# Patient Record
Sex: Female | Born: 1945 | Race: White | Hispanic: No | Marital: Married | Smoking: Former smoker
Health system: Southern US, Community
[De-identification: ages and names within clinical notes are randomized; demographics above are authoritative.]

## PROBLEM LIST (undated history)

## (undated) DIAGNOSIS — M1990 Unspecified osteoarthritis, unspecified site: Secondary | ICD-10-CM

## (undated) DIAGNOSIS — K635 Polyp of colon: Secondary | ICD-10-CM

## (undated) DIAGNOSIS — J449 Chronic obstructive pulmonary disease, unspecified: Secondary | ICD-10-CM

## (undated) DIAGNOSIS — F32A Depression, unspecified: Secondary | ICD-10-CM

## (undated) DIAGNOSIS — E119 Type 2 diabetes mellitus without complications: Secondary | ICD-10-CM

## (undated) DIAGNOSIS — F329 Major depressive disorder, single episode, unspecified: Secondary | ICD-10-CM

## (undated) DIAGNOSIS — C801 Malignant (primary) neoplasm, unspecified: Secondary | ICD-10-CM

## (undated) HISTORY — PX: PARATHYROIDECTOMY: SHX19

## (undated) HISTORY — PX: ANTERIOR CERVICAL DISCECTOMY: SHX1160

## (undated) HISTORY — DX: Type 2 diabetes mellitus without complications: E11.9

## (undated) HISTORY — DX: Major depressive disorder, single episode, unspecified: F32.9

## (undated) HISTORY — DX: Malignant (primary) neoplasm, unspecified: C80.1

## (undated) HISTORY — PX: BREAST LUMPECTOMY: SHX2

## (undated) HISTORY — DX: Polyp of colon: K63.5

## (undated) HISTORY — PX: POSTERIOR CERVICAL FUSION/FORAMINOTOMY: SHX5038

## (undated) HISTORY — DX: Depression, unspecified: F32.A

## (undated) HISTORY — PX: TONSILLECTOMY: SUR1361

## (undated) HISTORY — DX: Chronic obstructive pulmonary disease, unspecified: J44.9

## (undated) HISTORY — PX: ABDOMINAL HYSTERECTOMY: SHX81

## (undated) HISTORY — PX: OTHER SURGICAL HISTORY: SHX169

## (undated) HISTORY — DX: Unspecified osteoarthritis, unspecified site: M19.90

## (undated) HISTORY — PX: APPENDECTOMY: SHX54

---

## 1998-09-21 ENCOUNTER — Encounter: Payer: Self-pay | Admitting: Internal Medicine

## 1998-09-21 ENCOUNTER — Ambulatory Visit (HOSPITAL_COMMUNITY): Admission: RE | Admit: 1998-09-21 | Discharge: 1998-09-21 | Payer: Self-pay | Admitting: Internal Medicine

## 1998-09-29 ENCOUNTER — Ambulatory Visit (HOSPITAL_COMMUNITY): Admission: RE | Admit: 1998-09-29 | Discharge: 1998-09-29 | Payer: Self-pay | Admitting: Internal Medicine

## 1999-10-26 ENCOUNTER — Ambulatory Visit (HOSPITAL_COMMUNITY): Admission: RE | Admit: 1999-10-26 | Discharge: 1999-10-26 | Payer: Self-pay | Admitting: Family Medicine

## 1999-10-26 ENCOUNTER — Encounter: Payer: Self-pay | Admitting: Family Medicine

## 2004-03-14 ENCOUNTER — Ambulatory Visit: Payer: Self-pay | Admitting: Internal Medicine

## 2004-08-17 ENCOUNTER — Ambulatory Visit: Payer: Self-pay | Admitting: General Practice

## 2004-11-29 ENCOUNTER — Ambulatory Visit: Payer: Self-pay

## 2005-01-22 ENCOUNTER — Ambulatory Visit: Payer: Self-pay | Admitting: Internal Medicine

## 2005-01-25 ENCOUNTER — Ambulatory Visit: Payer: Self-pay | Admitting: Neurology

## 2005-02-20 ENCOUNTER — Ambulatory Visit: Payer: Self-pay | Admitting: Cardiology

## 2005-10-08 ENCOUNTER — Ambulatory Visit: Payer: Self-pay | Admitting: Internal Medicine

## 2005-12-18 ENCOUNTER — Ambulatory Visit: Payer: Self-pay

## 2006-07-17 ENCOUNTER — Ambulatory Visit: Payer: Self-pay | Admitting: Internal Medicine

## 2006-09-12 ENCOUNTER — Ambulatory Visit (HOSPITAL_COMMUNITY): Admission: RE | Admit: 2006-09-12 | Discharge: 2006-09-13 | Payer: Self-pay | Admitting: Neurosurgery

## 2006-12-30 ENCOUNTER — Ambulatory Visit: Payer: Self-pay

## 2007-04-14 ENCOUNTER — Ambulatory Visit: Payer: Self-pay | Admitting: Internal Medicine

## 2007-05-12 ENCOUNTER — Encounter: Payer: Self-pay | Admitting: Internal Medicine

## 2007-05-20 ENCOUNTER — Ambulatory Visit: Payer: Self-pay | Admitting: Internal Medicine

## 2007-06-04 ENCOUNTER — Encounter: Payer: Self-pay | Admitting: Internal Medicine

## 2007-07-05 ENCOUNTER — Encounter: Payer: Self-pay | Admitting: Internal Medicine

## 2007-08-02 ENCOUNTER — Encounter: Payer: Self-pay | Admitting: Internal Medicine

## 2007-08-19 ENCOUNTER — Ambulatory Visit: Payer: Self-pay | Admitting: Internal Medicine

## 2007-09-02 ENCOUNTER — Encounter: Payer: Self-pay | Admitting: Internal Medicine

## 2007-09-16 ENCOUNTER — Encounter: Payer: Self-pay | Admitting: Internal Medicine

## 2007-10-02 ENCOUNTER — Encounter: Payer: Self-pay | Admitting: Internal Medicine

## 2007-11-02 ENCOUNTER — Encounter: Payer: Self-pay | Admitting: Internal Medicine

## 2007-11-11 ENCOUNTER — Ambulatory Visit: Payer: Self-pay | Admitting: Neurosurgery

## 2007-12-11 ENCOUNTER — Encounter: Payer: Self-pay | Admitting: Internal Medicine

## 2007-12-31 ENCOUNTER — Ambulatory Visit: Payer: Self-pay

## 2008-01-02 ENCOUNTER — Encounter: Payer: Self-pay | Admitting: Internal Medicine

## 2008-02-02 ENCOUNTER — Encounter: Payer: Self-pay | Admitting: Internal Medicine

## 2008-02-26 ENCOUNTER — Ambulatory Visit (HOSPITAL_COMMUNITY): Admission: RE | Admit: 2008-02-26 | Discharge: 2008-02-27 | Payer: Self-pay | Admitting: Neurosurgery

## 2008-03-03 ENCOUNTER — Encounter: Payer: Self-pay | Admitting: Internal Medicine

## 2008-03-29 ENCOUNTER — Ambulatory Visit: Payer: Self-pay | Admitting: Neurosurgery

## 2008-04-01 ENCOUNTER — Ambulatory Visit: Payer: Self-pay | Admitting: Neurosurgery

## 2008-04-03 ENCOUNTER — Encounter: Payer: Self-pay | Admitting: Internal Medicine

## 2008-04-18 ENCOUNTER — Ambulatory Visit: Payer: Self-pay | Admitting: Neurosurgery

## 2008-04-29 ENCOUNTER — Ambulatory Visit: Payer: Self-pay | Admitting: Neurosurgery

## 2008-05-03 ENCOUNTER — Encounter: Payer: Self-pay | Admitting: Internal Medicine

## 2008-05-30 ENCOUNTER — Ambulatory Visit: Payer: Self-pay | Admitting: Neurosurgery

## 2008-06-29 ENCOUNTER — Encounter: Payer: Self-pay | Admitting: Internal Medicine

## 2008-07-04 ENCOUNTER — Encounter: Payer: Self-pay | Admitting: Internal Medicine

## 2008-08-01 ENCOUNTER — Encounter: Payer: Self-pay | Admitting: Internal Medicine

## 2008-09-01 ENCOUNTER — Encounter: Payer: Self-pay | Admitting: Internal Medicine

## 2008-10-01 ENCOUNTER — Encounter: Payer: Self-pay | Admitting: Internal Medicine

## 2008-10-07 ENCOUNTER — Ambulatory Visit: Payer: Self-pay | Admitting: Internal Medicine

## 2008-10-13 ENCOUNTER — Encounter: Payer: Self-pay | Admitting: Neurosurgery

## 2008-11-01 ENCOUNTER — Encounter: Payer: Self-pay | Admitting: Internal Medicine

## 2008-11-01 ENCOUNTER — Encounter: Payer: Self-pay | Admitting: Neurosurgery

## 2008-12-01 ENCOUNTER — Encounter: Payer: Self-pay | Admitting: Internal Medicine

## 2009-01-02 ENCOUNTER — Ambulatory Visit: Payer: Self-pay

## 2009-01-18 ENCOUNTER — Encounter: Payer: Self-pay | Admitting: Internal Medicine

## 2009-02-01 ENCOUNTER — Encounter: Payer: Self-pay | Admitting: Internal Medicine

## 2009-02-03 ENCOUNTER — Ambulatory Visit: Payer: Self-pay | Admitting: Unknown Physician Specialty

## 2009-03-03 ENCOUNTER — Encounter: Payer: Self-pay | Admitting: Internal Medicine

## 2009-04-03 ENCOUNTER — Encounter: Payer: Self-pay | Admitting: Internal Medicine

## 2009-05-03 ENCOUNTER — Encounter: Payer: Self-pay | Admitting: Internal Medicine

## 2009-06-03 ENCOUNTER — Encounter: Payer: Self-pay | Admitting: Internal Medicine

## 2009-07-04 ENCOUNTER — Encounter: Payer: Self-pay | Admitting: Internal Medicine

## 2009-08-01 ENCOUNTER — Encounter: Payer: Self-pay | Admitting: Internal Medicine

## 2009-09-01 ENCOUNTER — Encounter: Payer: Self-pay | Admitting: Internal Medicine

## 2009-10-01 ENCOUNTER — Encounter: Payer: Self-pay | Admitting: Internal Medicine

## 2009-10-25 ENCOUNTER — Other Ambulatory Visit: Payer: Self-pay | Admitting: Internal Medicine

## 2009-11-01 ENCOUNTER — Encounter: Payer: Self-pay | Admitting: Internal Medicine

## 2009-12-21 ENCOUNTER — Encounter: Payer: Self-pay | Admitting: Internal Medicine

## 2009-12-28 ENCOUNTER — Other Ambulatory Visit: Payer: Self-pay | Admitting: Internal Medicine

## 2009-12-29 ENCOUNTER — Ambulatory Visit: Payer: Self-pay | Admitting: Internal Medicine

## 2010-01-01 ENCOUNTER — Encounter: Payer: Self-pay | Admitting: Internal Medicine

## 2010-01-10 ENCOUNTER — Ambulatory Visit: Payer: Self-pay

## 2010-01-11 ENCOUNTER — Ambulatory Visit: Payer: Self-pay | Admitting: Urology

## 2010-01-25 ENCOUNTER — Ambulatory Visit: Payer: Self-pay | Admitting: Urology

## 2010-01-29 ENCOUNTER — Ambulatory Visit: Payer: Self-pay | Admitting: Internal Medicine

## 2010-02-01 ENCOUNTER — Encounter: Payer: Self-pay | Admitting: Internal Medicine

## 2010-02-27 ENCOUNTER — Ambulatory Visit: Payer: Self-pay | Admitting: Neurosurgery

## 2010-03-03 ENCOUNTER — Encounter: Payer: Self-pay | Admitting: Internal Medicine

## 2010-03-06 ENCOUNTER — Ambulatory Visit: Payer: Self-pay | Admitting: Neurosurgery

## 2010-04-03 ENCOUNTER — Encounter: Payer: Self-pay | Admitting: Internal Medicine

## 2010-04-10 ENCOUNTER — Ambulatory Visit: Payer: Self-pay | Admitting: Internal Medicine

## 2010-05-03 ENCOUNTER — Encounter: Payer: Self-pay | Admitting: Internal Medicine

## 2010-05-11 ENCOUNTER — Ambulatory Visit: Payer: Self-pay | Admitting: Ophthalmology

## 2010-05-16 ENCOUNTER — Ambulatory Visit: Payer: Self-pay | Admitting: Urology

## 2010-05-29 ENCOUNTER — Ambulatory Visit: Payer: Self-pay | Admitting: Ophthalmology

## 2010-06-03 ENCOUNTER — Encounter: Payer: Self-pay | Admitting: Internal Medicine

## 2010-06-06 IMAGING — CT CT CERVICAL SPINE WITHOUT CONTRAST
1 series · 12 of 14 positions shown, 15 images · non-contrast
Comparison: none

REASON FOR EXAM: occicipital C3    head and neck pain
COMMENTS:

[Series 5: axial · axial · 0.31mm/px · z∈[-313,-170]mm · 12 of 88 slices shown, 15 images]
[im 7/88  soft-tissue]
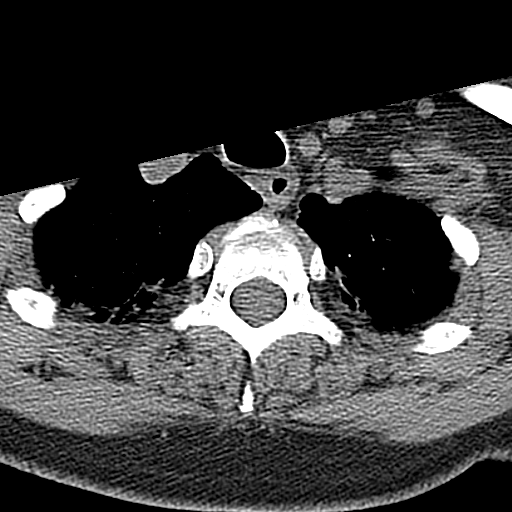
[im 7/88  bone]
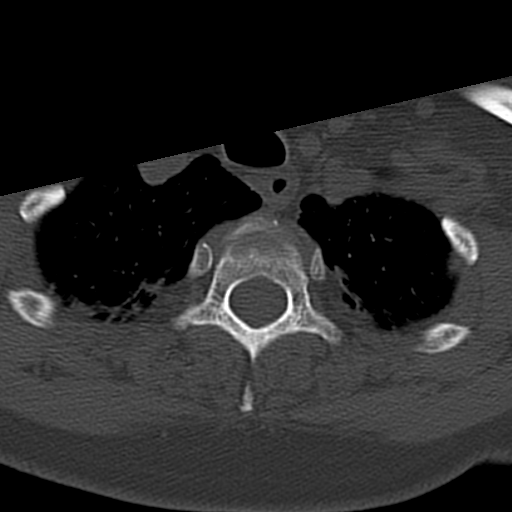
[im 14/88  bone]
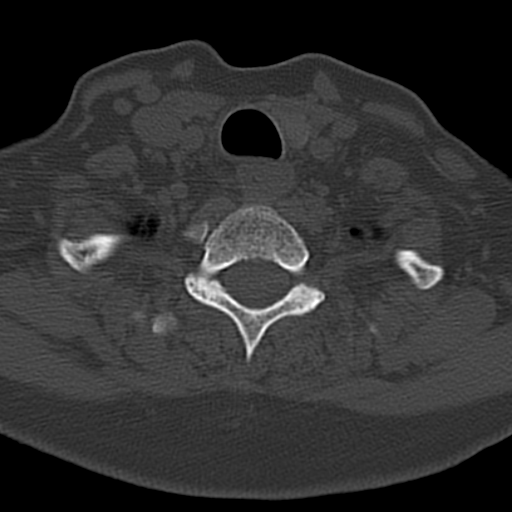
[im 21/88  bone]
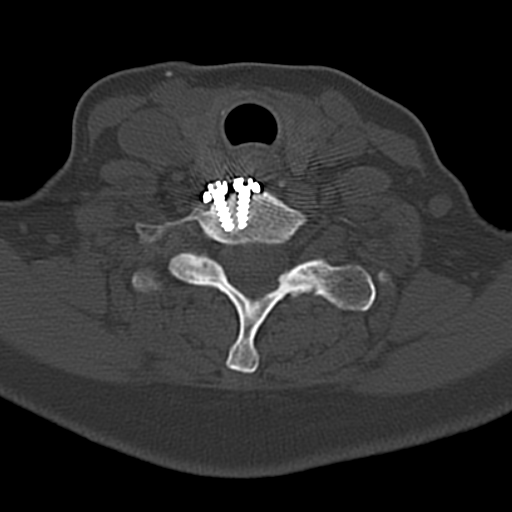
[im 27/88  bone]
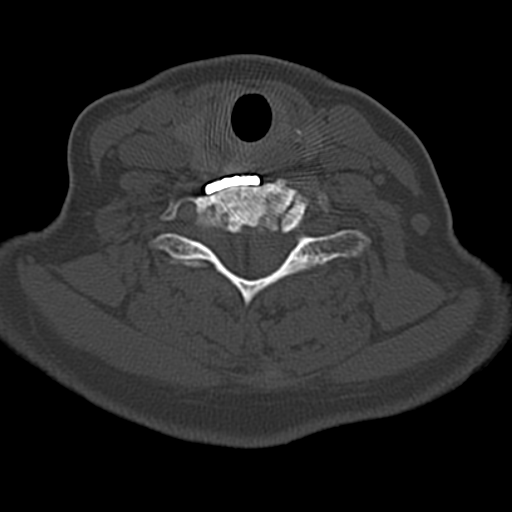
[im 34/88  soft-tissue]
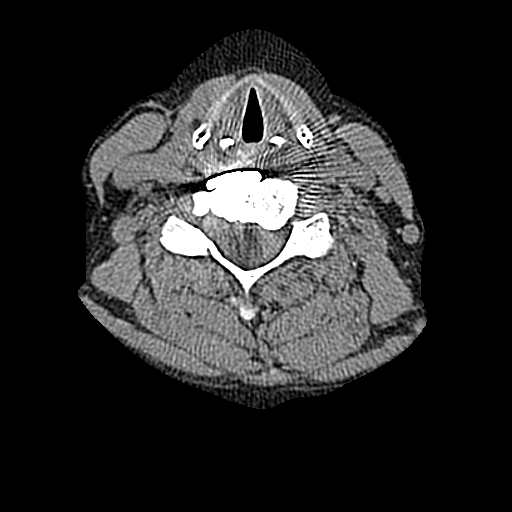
[im 34/88  bone]
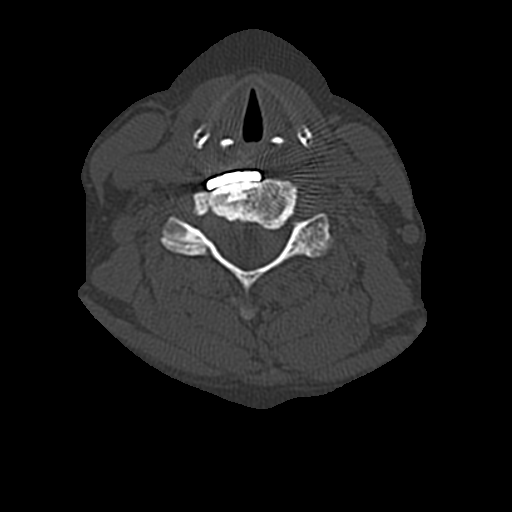
[im 41/88  bone]
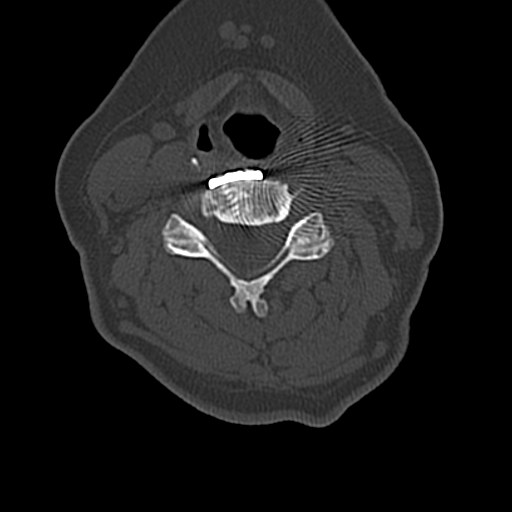
[im 47/88  bone]
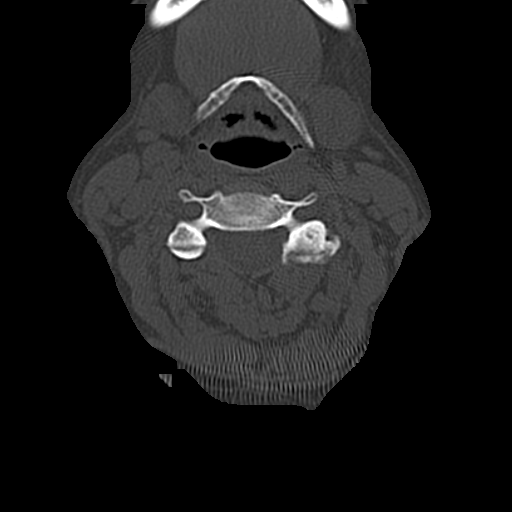
[im 54/88  bone]
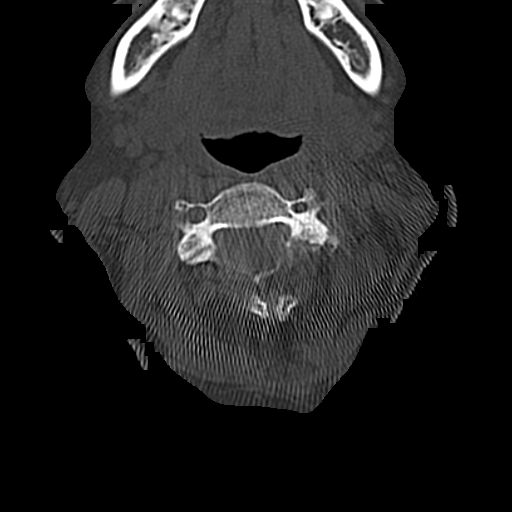
[im 61/88  soft-tissue]
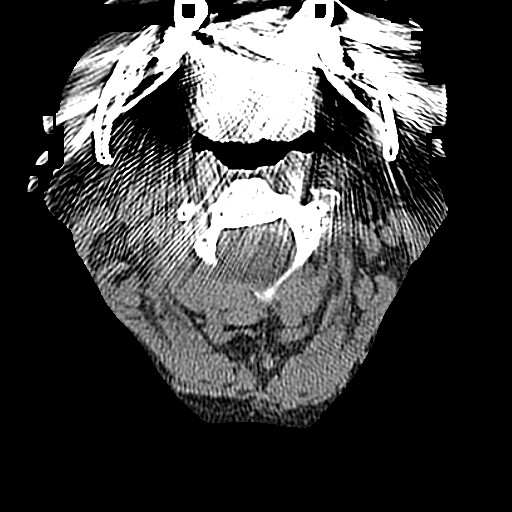
[im 61/88  bone]
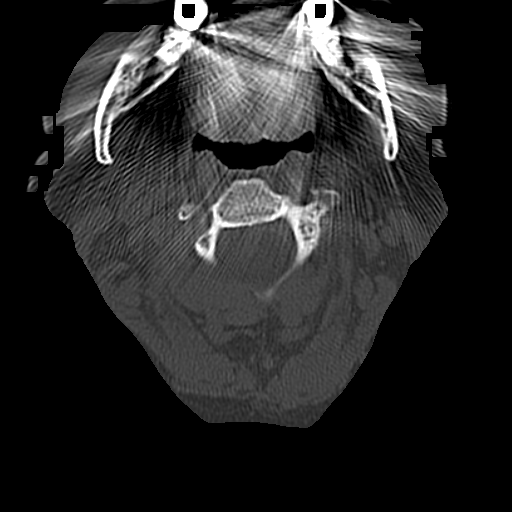
[im 67/88  bone]
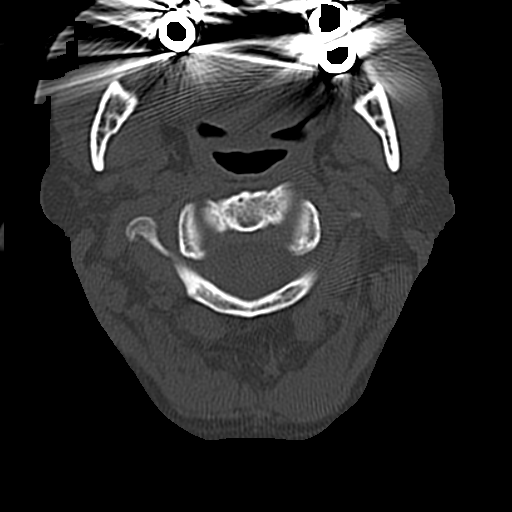
[im 74/88  bone]
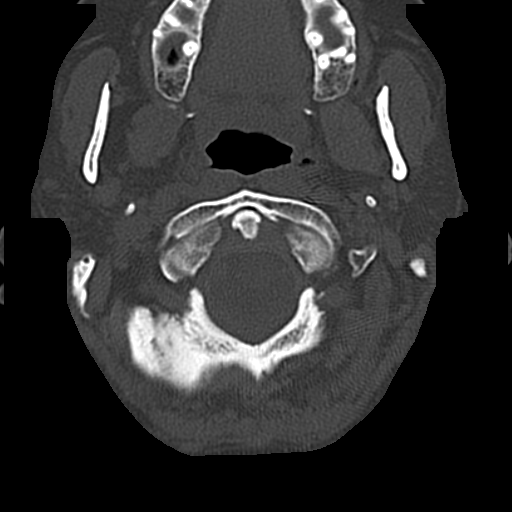
[im 81/88  bone]
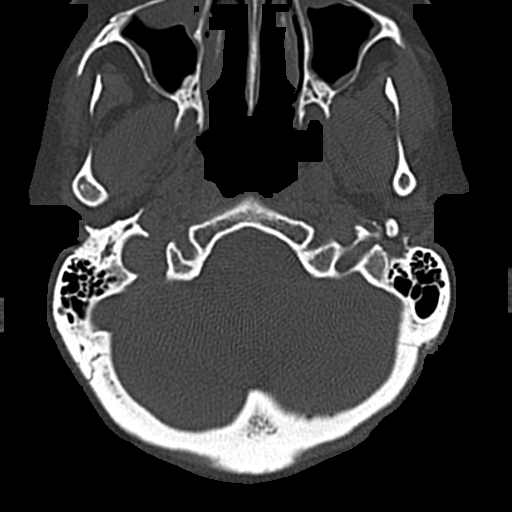

[12 of 14 positions shown; findings below may reference images not displayed]

PROCEDURE:     CT  - CT CERVICAL SPINE WO  - November 11, 2007 [DATE]

RESULT:     The patient is complaining of occipital and upper neck
discomfort. The patient has undergone prior fusion anteriorly across the
C4-5, C5-6 and C6-7 disc spaces.

The cervical vertebral bodies are preserved in height. The prevertebral soft
tissue spaces appear normal. The odontoid is intact. There is mild
degenerative change associated with the odontoid. The lateral masses of C1
align normally with those of C2. There is degenerative facet joint change on
the LEFT at the C3-C4 level and to a lesser extent at C2-C3. On the RIGHT,
no significant degenerative change is seen here. The observed portions of
the first and second ribs are normal. The bony ring at each cervical level
appears intact. There is lucency seen through the lateral mass of the body
of C6 on the LEFT that lies medial to the vertebral foramen. This appears
sclerotic in some areas suggesting that it is chronic.
IMPRESSION: 1. The patient has undergone fusion anteriorly across the C4 through C7
discs. There is no evidence of a compression fracture nor bony spinal
stenosis.
2. Degenerative change is noted involving the C3-C4 and C2-C3 facet joints
on the LEFT. No bony spinal stenosis is seen.
3. There is linear lucency seen on the LEFT through the lateral mass of C6
that is likely chronic.

## 2010-07-04 ENCOUNTER — Encounter: Payer: Self-pay | Admitting: Internal Medicine

## 2010-08-02 ENCOUNTER — Encounter: Payer: Self-pay | Admitting: Internal Medicine

## 2010-09-02 ENCOUNTER — Encounter: Payer: Self-pay | Admitting: Internal Medicine

## 2010-09-10 ENCOUNTER — Ambulatory Visit: Payer: Self-pay | Admitting: Podiatry

## 2010-10-02 ENCOUNTER — Encounter: Payer: Self-pay | Admitting: Internal Medicine

## 2010-10-16 NOTE — Op Note (Signed)
Lorraine Miller, Lorraine Miller                ACCOUNT NO.:  [id number]   MEDICAL RECORD NO.:  [id number]          PATIENT TYPE:  OIB   LOCATION:  3537                         FACILITY:  [facility]   PHYSICIAN:  Donalee Citrin, M.D.        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   PROCEDURE:  Posterior cervical fusion C6-7 using the Vertex lateral mass  system with local autograft harvested from the spinous process of T1 and  placed in the facet complex as well as augmented with Mastergraft and a  small BMP kit.   SURGEON:  Donalee Citrin, MD   ASSISTANT:  Tia Alert, MD   ANESTHESIA:  General endotracheal.   HISTORY OF PRESENT ILLNESS:  The patient is a very pleasant 65 year old  female who underwent a little less than a year ago an anterior cervical  diskectomy and fusion at C4-5, C5-6, and C6-7.  C4-5-6 was fused  solidly.  She did well for about 6-8 months and then had progressive  worsening of neck pain in the base of her neck without radiation.  Repeat imaging showed pseudoarthrosis of C6-7 and motion on flexion and  extension films.  The patient failed all forms of conservative treatment  with this and the patient was recommended posterior cervical  augmentation of C6-7.  Risks and benefits of the operation were  explained to the patient.  She agreed to proceed forth.   The patient was brought to the OR and was induced under general  anesthesia, positioned prone in pins.  The back side of the neck was  prepped and draped in usual sterile fashion.  Pressure localized at the  appropriate level.  After infiltration of 10 mL of lidocaine with  epinephrine, a midline incision was made and Bovie electrocautery was  used to take down and a subperiosteal dissection carried out in lamina  of C6, C7, and T1.  The lateral masses of C6 and C7 bilaterally were  exposed.  Then, the part of the spinous process of T1 was excised for  autograft harvest.  The intraoperative x-ray confirmed localization  at  the appropriate level.  Then using the inferomedial quadrant of the  lateral mass projecting out superolaterally, pilot holes were drilled  with a high-speed drill then using the TPS and a guide.  The lateral  mass holes were drilled approximately 13 mm, then neocortex was tapped  and a 14-mm lateral mass was placed at C6 and C7 bilaterally.  All 4  screws had excellent purchase.  The holes were explored at each step  along the way to confirm competency in a 360-degree orientation.  After  all her screws placed, the wound was copiously irrigated.  Aggressive  decortication was carried out in the facet complexes and then around the  lateral masses at C6-7.  Autograft was packed within the facet joint.  BMP was overlaid on top of the facet joint and lateral masses.  Mastergraft was then  overlaid on top of the BMP.  Then locking mechanism  rods were inserted cut and fashioned and tightened down at C7.  Then,  the nuts were anchored down and torqued down at C6.  A medium  Hemovac drain was placed.  Meticulous hemostasis was maintained, and the  wound was closed in layers with interrupted Vicryl with running 4-0  subcuticular on skin.  Benzoin and Steri-Strips applied.  The patient  went to recovery room in stable condition.  At the end of the case,  sponge counts were correct.           ______________________________  Donalee Citrin, M.D.     GC/MEDQ  D:  02/26/2008  T:  02/27/2008  Job:  [id number]

## 2010-10-19 NOTE — Op Note (Signed)
Lorraine, Miller                ACCOUNT NO.:  [id number]   MEDICAL RECORD NO.:  [id number]          PATIENT TYPE:  AMB   LOCATION:  SDS                          FACILITY:  [facility]   PHYSICIAN:  Donalee Citrin, M.D.        DATE OF BIRTH:  [birth year]   DATE OF PROCEDURE:  09/12/2006  DATE OF DISCHARGE:                               OPERATIVE REPORT   PREOPERATIVE DIAGNOSIS:  Cervical spondylosis with stenosis and  radiculopathy at C4-5, C5-6, and C6-7.   PROCEDURE:  Intracervical diskectomies and fusion at C4-5, C5-6, and C6-  7 using 6-mm allograft wedge at C4-5, 7-mm wedges at C5-6 and C6-7, a  52.5-mm Venture plate with eight 13-mm variable angled screws.  All  screws had excellent purchase.   SURGEON:  Donalee Citrin, MD   ASSISTANT:  Yetta Barre.   ANESTHESIA:  General endotracheal.   HISTORY OF PRESENT ILLNESS:  The patient is a very pleasant 65 year old  female with longstanding neck pain and predominantly bilateral arm pain  but much worse on the right.  The patient failed all forms of  conservative treatment and does have the weakness of her triceps  preoperatively and cervical spine MRI showed severe spondylitic disease  with spinal stenosis and foraminal stenosis at C4-5, C5-6 and C6-7.  Due  to the patient's failure of conservative treatment and MRI findings on  physical exam, the patient was recommended anterior cervical  diskectomies and fusion.  Risks and benefits of the operation were  explained to the patient.  She understands and agreed to proceed forth.   The patient was brought to the OR and was induced under general  anesthesia.  She was placed supine.  The neck was placed in extension, 5  pounds of traction on the right side.  Her neck was prepped and draped  in the usual sterile fashion.  Preoperative x-ray localized the C6  vertebral body.  A curvilinear incision was made just off the midline to  anterior portion to the sternocleidomastoid and superficial layer of  the  platysma was dissected and divided longitudinally.  The avascular planes  to the sternocleidomastoid strap muscles was developed.  Prevertebral  fascia was then incised with Kitner's.  Intraoperative x-ray confirmed  localization of the C5-6 interspace.  This was marked by making an  annulotomy with the 15 blade scalpel.  Then the longus colli was  reflected laterally both above and below at the upper and lower disk  spaces from this.  Subcutaneous tension was placed.  Annulotomies were  made in all 3 disk spaces and all 3 disk spaces were drilled down to  posterior annulus and posterior osteophyte complexes.   At this point, the operative microscope was draped.  Under microscopic  illumination, the C4-5 disk space was drilled down.  Using 1 and 2 mm  Kerrison punch, the undersurface of the end plates at both the C4 and C5  were underbitten decompressing the central canal and marching across to  the left C5 neural foramen C5 pedicle.  The C5 nerve root was identified  and decompressed flush with  the pedicle and the undersurface of the end  plates were all underbitten.  There was a large osteophytic complex  coming off the C4 vertebral body.  This was all underbitten and  decompressing the central canal.  Then the right side of the proximal C5  nerve root and C5 pedicle were identified and decompressed.  Both end  plates were underbitten to decompress the central canal and after  adequate decompression had been achieved, Gelfoam was placed.   Attention was taken to the C5-6.  In a similar fashion, C6-7 was  prepared.  A very large osteophyte coming off the C6 vertebral body  compressing the proximal right C7 nerve root.  This was all underbitten  and the C7 nerve root as skeletonized flush with the C7 pedicle.  The  undersurface of the end plate at C7 was also underbitten and marched  across to left C7 pedicle and left C7 nerve root. After this had been  adequately decompressed,  checking the C5-6.  Again at C5-6, fairly  extensive osteophytosis with extensive bone spur coming off the C5  vertebral body as well as C6 causing severe stenosis and compression of  the right side of the spinal cord and right C6 nerve root.  This was all  underbitten, decompressing the central canal.  Both pedicles were  identified, both neural foramina were widely opened up and at this  point, after all 3 interspaces had been decompressed, attention taken to  the interbody work.   Size 7-mm allografts were inserted after preparation of the end plates  had been completed at both 5-6 and 6-7 and a 6-mm allograft at 4-5.  A  52.5 mm Venture plate was then sized, selected and placed.  Eight 13-mm  variable angled screws were all placed.  All screws had excellent  purchase.  Fluoroscopy confirmed good position of the screws and the  plate and the bone grafts.  The wound was then copiously irrigated and  meticulous hemostasis was maintained.  Platysma was reapproximated with  interrupted Vicryl and the skin was closed with running 4-0 subcuticular  and benzoin and Steri-Strips were applied.  The patient went to the  recovery room in stable condition.  At the end of the case, needle  counts and sponge counts were correct.           ______________________________  Donalee Citrin, M.D.     GC/MEDQ  D:  09/12/2006  T:  09/12/2006  Job:  [id number]

## 2010-11-02 ENCOUNTER — Encounter: Payer: Self-pay | Admitting: Internal Medicine

## 2010-12-02 ENCOUNTER — Encounter: Payer: Self-pay | Admitting: Internal Medicine

## 2011-01-02 ENCOUNTER — Encounter: Payer: Self-pay | Admitting: Internal Medicine

## 2011-01-29 ENCOUNTER — Ambulatory Visit: Payer: Self-pay | Admitting: Internal Medicine

## 2011-01-31 ENCOUNTER — Ambulatory Visit: Payer: Self-pay | Admitting: Internal Medicine

## 2011-02-02 ENCOUNTER — Encounter: Payer: Self-pay | Admitting: Internal Medicine

## 2011-02-21 ENCOUNTER — Ambulatory Visit: Payer: Self-pay

## 2011-03-04 ENCOUNTER — Encounter: Payer: Self-pay | Admitting: Internal Medicine

## 2011-03-04 LAB — APTT: aPTT: 44 — ABNORMAL HIGH

## 2011-03-04 LAB — CBC
Platelets: 350
RDW: 12.6

## 2011-03-04 LAB — GLUCOSE, CAPILLARY
Glucose-Capillary: 126 — ABNORMAL HIGH
Glucose-Capillary: 158 — ABNORMAL HIGH
Glucose-Capillary: 171 — ABNORMAL HIGH
Glucose-Capillary: 174 — ABNORMAL HIGH

## 2011-03-04 LAB — BASIC METABOLIC PANEL
BUN: 12
Calcium: 10.8 — ABNORMAL HIGH
Creatinine, Ser: 0.67
GFR calc non Af Amer: >60
Glucose, Bld: 96

## 2011-03-04 LAB — PROTIME-INR
Prothrombin Time: 12.6
Prothrombin Time: 28.2 — ABNORMAL HIGH

## 2011-03-04 LAB — DIFFERENTIAL
Basophils Absolute: 0.1
Basophils Relative: 1
Eosinophils Relative: 3
Lymphocytes Relative: 37
Neutro Abs: 4.3

## 2011-03-07 ENCOUNTER — Ambulatory Visit: Payer: Self-pay | Admitting: Surgery

## 2011-03-11 ENCOUNTER — Ambulatory Visit: Payer: Self-pay | Admitting: Surgery

## 2011-03-22 LAB — PATHOLOGY REPORT

## 2011-04-03 ENCOUNTER — Ambulatory Visit: Payer: Self-pay | Admitting: Surgery

## 2011-04-08 ENCOUNTER — Ambulatory Visit: Payer: Self-pay | Admitting: Surgery

## 2011-04-09 ENCOUNTER — Inpatient Hospital Stay: Payer: Self-pay | Admitting: Surgery

## 2011-04-14 LAB — PATHOLOGY REPORT

## 2011-04-18 ENCOUNTER — Ambulatory Visit: Payer: Self-pay | Admitting: Internal Medicine

## 2011-04-20 ENCOUNTER — Emergency Department: Payer: Self-pay | Admitting: Internal Medicine

## 2011-04-23 ENCOUNTER — Other Ambulatory Visit: Payer: Self-pay | Admitting: Internal Medicine

## 2011-04-23 ENCOUNTER — Ambulatory Visit: Payer: Self-pay | Admitting: Internal Medicine

## 2011-05-04 ENCOUNTER — Ambulatory Visit: Payer: Self-pay | Admitting: Internal Medicine

## 2011-05-04 ENCOUNTER — Other Ambulatory Visit: Payer: Self-pay | Admitting: Internal Medicine

## 2011-05-07 ENCOUNTER — Ambulatory Visit: Payer: Self-pay | Admitting: Internal Medicine

## 2011-06-04 ENCOUNTER — Other Ambulatory Visit: Payer: Self-pay | Admitting: Internal Medicine

## 2011-07-05 ENCOUNTER — Other Ambulatory Visit: Payer: Self-pay | Admitting: Internal Medicine

## 2011-08-02 ENCOUNTER — Other Ambulatory Visit: Payer: Self-pay | Admitting: Internal Medicine

## 2011-08-22 LAB — PROTIME-INR
INR: 2.6
Prothrombin Time: 27.6 secs — ABNORMAL HIGH (ref 11.5–14.7)

## 2011-09-02 ENCOUNTER — Other Ambulatory Visit: Payer: Self-pay | Admitting: Internal Medicine

## 2011-10-02 ENCOUNTER — Other Ambulatory Visit: Payer: Self-pay | Admitting: Internal Medicine

## 2011-10-24 ENCOUNTER — Ambulatory Visit: Payer: Self-pay

## 2011-10-24 LAB — CBC
MCH: 29.9 pg (ref 26.0–34.0)
MCHC: 33.5 g/dL (ref 32.0–36.0)
MCV: 89 fL (ref 80–100)
RBC: 4.58 10*6/uL (ref 3.80–5.20)
WBC: 7 10*3/uL (ref 3.6–11.0)

## 2011-10-24 LAB — BASIC METABOLIC PANEL
EGFR (Non-African Amer.): >60
Glucose: 88 mg/dL (ref 65–99)
Osmolality: 279 (ref 275–301)
Potassium: 4 mmol/L (ref 3.5–5.1)

## 2011-10-24 LAB — URINALYSIS, COMPLETE
Bacteria: NONE SEEN
Bilirubin,UR: NEGATIVE
Blood: NEGATIVE
Glucose,UR: NEGATIVE mg/dL (ref 0–75)
Ketone: NEGATIVE
Nitrite: NEGATIVE
Protein: NEGATIVE
RBC,UR: 1 /HPF (ref 0–5)
Squamous Epithelial: 1

## 2011-10-24 LAB — PROTIME-INR: INR: 1.3

## 2011-10-31 ENCOUNTER — Ambulatory Visit: Payer: Self-pay

## 2011-10-31 LAB — PROTIME-INR: INR: 0.9

## 2011-11-18 ENCOUNTER — Other Ambulatory Visit: Payer: Self-pay | Admitting: Internal Medicine

## 2011-11-18 LAB — PROTIME-INR: Prothrombin Time: 26.2 secs — ABNORMAL HIGH (ref 11.5–14.7)

## 2011-12-02 ENCOUNTER — Other Ambulatory Visit: Payer: Self-pay | Admitting: Internal Medicine

## 2011-12-16 ENCOUNTER — Ambulatory Visit: Payer: Self-pay

## 2011-12-16 LAB — URINALYSIS, COMPLETE
Bilirubin,UR: NEGATIVE
Glucose,UR: NEGATIVE mg/dL (ref 0–75)
Leukocyte Esterase: NEGATIVE
Nitrite: NEGATIVE
Ph: 7.5 (ref 4.5–8.0)
Specific Gravity: 1.015 (ref 1.003–1.030)

## 2011-12-17 LAB — URINE CULTURE

## 2011-12-23 LAB — PROTIME-INR: Prothrombin Time: 34.9 secs — ABNORMAL HIGH (ref 11.5–14.7)

## 2011-12-26 ENCOUNTER — Inpatient Hospital Stay: Payer: Self-pay | Admitting: Surgery

## 2011-12-26 LAB — BASIC METABOLIC PANEL
Calcium, Total: 10.1 mg/dL (ref 8.5–10.1)
Chloride: 104 mmol/L (ref 98–107)
Co2: 31 mmol/L (ref 21–32)
Potassium: 4 mmol/L (ref 3.5–5.1)
Sodium: 142 mmol/L (ref 136–145)

## 2011-12-27 LAB — BASIC METABOLIC PANEL
BUN: 9 mg/dL (ref 7–18)
Creatinine: 0.75 mg/dL (ref 0.60–1.30)
EGFR (Non-African Amer.): >60
Glucose: 129 mg/dL — ABNORMAL HIGH (ref 65–99)
Potassium: 4.3 mmol/L (ref 3.5–5.1)
Sodium: 140 mmol/L (ref 136–145)

## 2011-12-27 LAB — CBC WITH DIFFERENTIAL/PLATELET
Basophil #: 0 10*3/uL (ref 0.0–0.1)
HCT: 39 % (ref 35.0–47.0)
Lymphocyte #: 2.1 10*3/uL (ref 1.0–3.6)
MCH: 30.1 pg (ref 26.0–34.0)
MCV: 89 fL (ref 80–100)
Monocyte #: 0.8 x10 3/mm (ref 0.2–0.9)
Monocyte %: 6.3 %
Neutrophil #: 8.8 10*3/uL — ABNORMAL HIGH (ref 1.4–6.5)
Platelet: 382 10*3/uL (ref 150–440)
RDW: 12.8 % (ref 11.5–14.5)
WBC: 11.9 10*3/uL — ABNORMAL HIGH (ref 3.6–11.0)

## 2011-12-27 LAB — PROTIME-INR: INR: 1.5

## 2011-12-28 LAB — CBC WITH DIFFERENTIAL/PLATELET
Basophil #: 0.1 10*3/uL (ref 0.0–0.1)
Eosinophil #: 0.3 10*3/uL (ref 0.0–0.7)
MCH: 29.7 pg (ref 26.0–34.0)
MCHC: 33.5 g/dL (ref 32.0–36.0)
MCV: 88 fL (ref 80–100)
Monocyte %: 4.7 %
Neutrophil #: 8.7 10*3/uL — ABNORMAL HIGH (ref 1.4–6.5)
Platelet: 392 10*3/uL (ref 150–440)
RDW: 12.3 % (ref 11.5–14.5)
WBC: 12.4 10*3/uL — ABNORMAL HIGH (ref 3.6–11.0)

## 2011-12-28 LAB — PROTIME-INR
INR: 1.6
Prothrombin Time: 19.7 secs — ABNORMAL HIGH (ref 11.5–14.7)

## 2011-12-29 LAB — CBC WITH DIFFERENTIAL/PLATELET
Basophil #: 0.1 10*3/uL (ref 0.0–0.1)
Basophil %: 0.5 %
Eosinophil %: 2.3 %
HGB: 13.8 g/dL (ref 12.0–16.0)
Lymphocyte #: 2.8 10*3/uL (ref 1.0–3.6)
Lymphocyte %: 19.7 %
MCH: 30.3 pg (ref 26.0–34.0)
Neutrophil %: 71.4 %
RBC: 4.56 10*6/uL (ref 3.80–5.20)

## 2011-12-29 LAB — PROTIME-INR: INR: 2.2

## 2011-12-30 LAB — CBC WITH DIFFERENTIAL/PLATELET
Basophil %: 0.3 %
Eosinophil #: 0.4 10*3/uL (ref 0.0–0.7)
HCT: 37.3 % (ref 35.0–47.0)
HGB: 12.6 g/dL (ref 12.0–16.0)
Lymphocyte #: 3.7 10*3/uL — ABNORMAL HIGH (ref 1.0–3.6)
Lymphocyte %: 31.5 %
MCH: 30.1 pg (ref 26.0–34.0)
MCHC: 33.8 g/dL (ref 32.0–36.0)
MCV: 89 fL (ref 80–100)
Monocyte #: 0.9 x10 3/mm (ref 0.2–0.9)
Neutrophil #: 6.6 10*3/uL — ABNORMAL HIGH (ref 1.4–6.5)
RDW: 12.6 % (ref 11.5–14.5)

## 2011-12-30 LAB — PROTIME-INR: INR: 2.2

## 2012-01-02 ENCOUNTER — Other Ambulatory Visit: Payer: Self-pay | Admitting: Internal Medicine

## 2012-01-02 LAB — WOUND CULTURE

## 2012-02-13 ENCOUNTER — Ambulatory Visit: Payer: Self-pay

## 2012-04-21 ENCOUNTER — Ambulatory Visit: Payer: Self-pay

## 2012-08-20 IMAGING — CR DG ABDOMEN 1V
1 series · 1 of 1 positions shown · non-contrast
Comparison: none

REASON FOR EXAM: NEPHROLITHIASIS PT NEED FILMS
COMMENTS:

PROCEDURE:     DXR - DXR KIDNEY URETER BLADDER  - January 25, 2010  [DATE]
RESULT:     Comparisons:  None

[view not recorded]
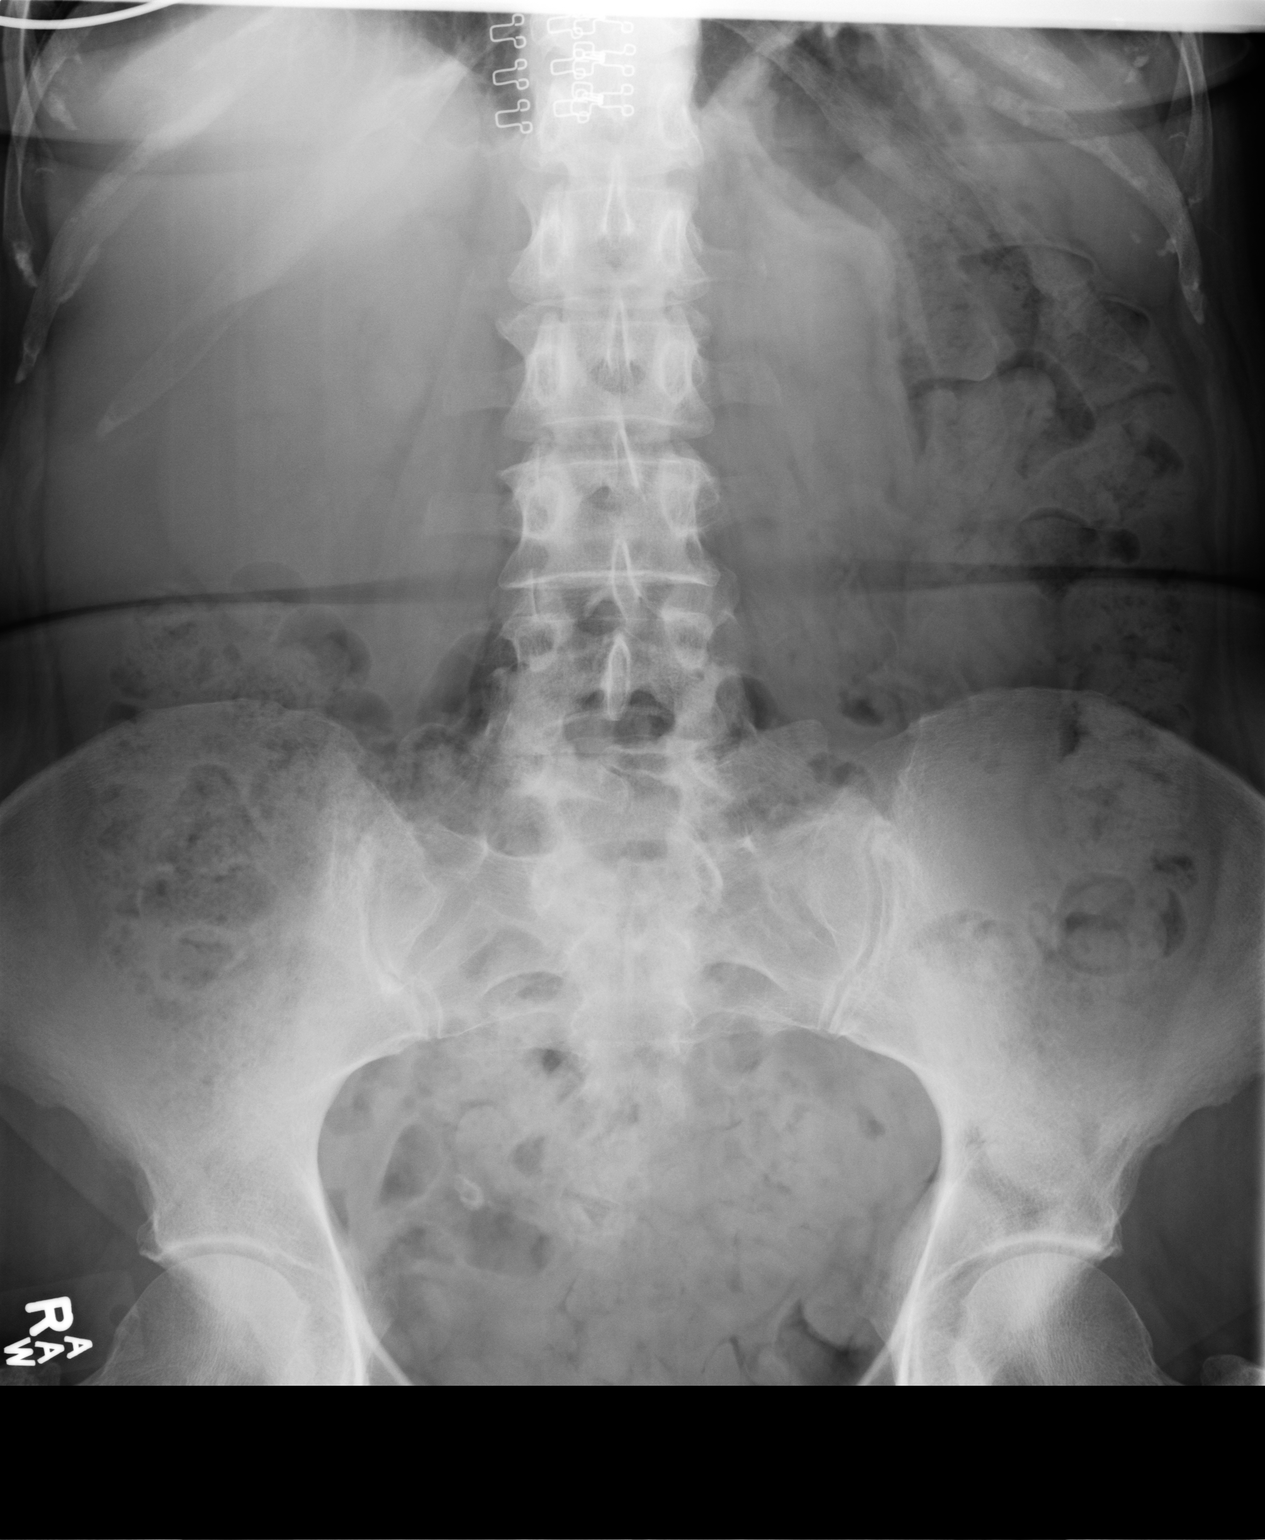

[1 of 1 positions shown; findings below may reference images not displayed]

FINDINGS: Supine and upright views of the abdomen are provided.

There is a nonspecific bowel gas pattern. There is no bowel dilatation to
suggest obstruction. There are no air-fluid levels. There is no pathologic
calcification along the expected course of the ureters. There is no evidence
of pneumoperitoneum, portal venous gas, or pneumatosis.

The osseous structures are unremarkable.
IMPRESSION: Unremarkable abdominal radiograph.

## 2012-10-29 ENCOUNTER — Ambulatory Visit: Payer: Self-pay | Admitting: Cardiology

## 2013-09-30 ENCOUNTER — Ambulatory Visit: Payer: Self-pay | Admitting: Internal Medicine

## 2013-10-18 ENCOUNTER — Ambulatory Visit: Payer: Self-pay | Admitting: Internal Medicine

## 2013-12-20 ENCOUNTER — Ambulatory Visit: Payer: Self-pay | Admitting: Surgery

## 2013-12-20 DIAGNOSIS — I10 Essential (primary) hypertension: Secondary | ICD-10-CM

## 2013-12-20 LAB — CBC WITH DIFFERENTIAL/PLATELET
Basophil #: 0 10*3/uL (ref 0.0–0.1)
Basophil %: 0.5 %
Eosinophil #: 0.4 10*3/uL (ref 0.0–0.7)
Eosinophil %: 4 %
HCT: 42.3 % (ref 35.0–47.0)
HGB: 13.9 g/dL (ref 12.0–16.0)
LYMPHS ABS: 3.1 10*3/uL (ref 1.0–3.6)
Lymphocyte %: 36.2 %
MCH: 30 pg (ref 26.0–34.0)
MCHC: 32.9 g/dL (ref 32.0–36.0)
MCV: 91 fL (ref 80–100)
MONO ABS: 0.5 x10 3/mm (ref 0.2–0.9)
MONOS PCT: 6.3 %
Neutrophil #: 4.6 10*3/uL (ref 1.4–6.5)
Neutrophil %: 53 %
Platelet: 320 10*3/uL (ref 150–440)
RBC: 4.64 10*6/uL (ref 3.80–5.20)
RDW: 12.6 % (ref 11.5–14.5)
WBC: 8.7 10*3/uL (ref 3.6–11.0)

## 2013-12-20 LAB — PROTIME-INR
INR: 0.9
Prothrombin Time: 12.2 secs (ref 11.5–14.7)

## 2013-12-20 LAB — PHOSPHORUS: Phosphorus: 2.5 mg/dL (ref 2.5–4.9)

## 2013-12-20 LAB — BASIC METABOLIC PANEL
Anion Gap: 6 — ABNORMAL LOW (ref 7–16)
BUN: 16 mg/dL (ref 7–18)
CALCIUM: 9.9 mg/dL (ref 8.5–10.1)
CREATININE: 0.8 mg/dL (ref 0.60–1.30)
Chloride: 106 mmol/L (ref 98–107)
Co2: 28 mmol/L (ref 21–32)
EGFR (African American): >60
GLUCOSE: 97 mg/dL (ref 65–99)
Osmolality: 281 (ref 275–301)
Potassium: 4.2 mmol/L (ref 3.5–5.1)
Sodium: 140 mmol/L (ref 136–145)

## 2013-12-20 LAB — HEPATIC FUNCTION PANEL A (ARMC)
Albumin: 3.9 g/dL (ref 3.4–5.0)
Alkaline Phosphatase: 88 U/L
BILIRUBIN DIRECT: 0.1 mg/dL (ref 0.00–0.20)
Bilirubin,Total: 1 mg/dL (ref 0.2–1.0)
SGOT(AST): 25 U/L (ref 15–37)
SGPT (ALT): 38 U/L (ref 12–78)
TOTAL PROTEIN: 6.9 g/dL (ref 6.4–8.2)

## 2013-12-20 LAB — APTT: ACTIVATED PTT: 29 s (ref 23.6–35.9)

## 2013-12-27 ENCOUNTER — Ambulatory Visit: Payer: Self-pay | Admitting: Surgery

## 2013-12-28 LAB — PATHOLOGY REPORT

## 2013-12-29 ENCOUNTER — Other Ambulatory Visit: Payer: Self-pay | Admitting: Surgery

## 2013-12-29 LAB — RENAL FUNCTION PANEL
ANION GAP: 5 — AB (ref 7–16)
Albumin: 3.5 g/dL (ref 3.4–5.0)
BUN: 10 mg/dL (ref 7–18)
CALCIUM: 8.7 mg/dL (ref 8.5–10.1)
Chloride: 105 mmol/L (ref 98–107)
Co2: 32 mmol/L (ref 21–32)
Creatinine: 0.8 mg/dL (ref 0.60–1.30)
EGFR (African American): >60
Glucose: 119 mg/dL — ABNORMAL HIGH (ref 65–99)
Osmolality: 283 (ref 275–301)
Phosphorus: 3.4 mg/dL (ref 2.5–4.9)
Potassium: 3.7 mmol/L (ref 3.5–5.1)
SODIUM: 142 mmol/L (ref 136–145)

## 2014-09-20 NOTE — Op Note (Signed)
PATIENT NAME:  Lorraine Miller, Lorraine Miller MR#:  [id number] DATE OF BIRTH:  [birth year]  DATE OF PROCEDURE:  12/29/2011  PREOPERATIVE DIAGNOSIS: Cellulitis left breast status post expander based reconstruction.   POSTOPERATIVE DIAGNOSIS: Cellulitis left breast status post expander based reconstruction with cloudy yellow fluid.  PROCEDURE: Removal of expander and irrigation with debridement.   STAFF SURGEON: Cleda Daub, MD   ANESTHESIA: General endotracheal.   FINDINGS:  1. 150 mL of yellow cloudy fluid.  2. Fully incorporated and unaffected dermal matrix.  3. No other evidence of abscess pocket or cavity.   DRAINS: 15 [place] Blake drain x2.   DISPOSITION: Stable to recovery.   STATEMENT OF NECESSITY: Skyleen is a pleasant 69 year old female who developed a urinary tract infection approximately 15 days ago. This was treated with antibiotics and, unfortunately, she subsequently developed a cellulitis and red breast after tissue-based expander. This has not resolved with IV antibiotics and we have discussed the options thoroughly. We discussed attempt at salvage including replacing the expander immediately and continuing to attempt to salvage the reconstruction. The patient prefers to allow things to heal and return in a few weeks for a new tissue expander. All of the alternatives were thoroughly discussed with her including the advantages and disadvantages, risks and benefits of each. She has requested removal of the expander.   PROCEDURE: The patient was brought to the operating room awake, alert, and comfortable and placed on the OR table in a supine position. Endotracheal anesthesia was introduced without apparent complication and the patient was prepped and draped in the usual sterile fashion.   Directing attention towards the left breast, the previous incision was opened in the lateral third for approximately 2 cm in length. There was minimal bleeding. Electrocautery was used because of the patient's  INR of 2 and the capsule was entered. A rush of yellow cloudy fluid was encountered and this was aspirated. Swabs were taken and the fluid was collected and sent for culture. Aerobic and anaerobic cultures were taken. The total fluid was approximately 150 mL. The Yankauer suction was used to mobilize the expander away from the flap and then the expander was punctured and the blue fluid was suctioned. The expander was easily removed from the cavity and then the cavity was examined. The AlloDerm was 100% incorporated. It had not sloughed away from the infection. The tissue that was most cellulitic was examined and there was no evidence of abscess cavity or pocket. The entire cavity was then irrigated with 3 liters of bacitracin containing irrigation. Next, the wound was closed over drains using 3-0 Vicryl and 4-0 Monocryl. Needle, sponge, and lap counts are correct. A compressive dressing was applied. She tolerated the procedure well and was transferred to recovery in good condition.   ____________________________ Cleda Daub, MD bsc:drc D: 12/29/2011 13:31:37 ET T: 12/29/2011 14:31:45 ET JOB#: [id number]  cc: Cleda Daub, MD, <Dictator> Cleda Daub MD ELECTRONICALLY SIGNED 01/02/2012 11:40

## 2014-09-20 NOTE — Consult Note (Signed)
PATIENT NAME:  Lorraine, Miller MR#:  [id number] DATE OF BIRTH:  Dec 26, 1945  DATE OF CONSULTATION:  12/27/2011  REFERRING PHYSICIAN:  Nicholaus Bloom, MD CONSULTING PHYSICIAN:  Heinz Knuckles. Daoud Lobue, MD  REASON FOR CONSULTATION: Left breast cellulitis following breast reconstruction.   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 She had also noted some urinary symptoms. She was seen by her plastic surgeon who felt that the breast at that time had no evidence for infection. She went to urgent care and had a urinalysis obtained which showed 1+ blood and otherwise was completely negative. A urine culture at that time had mixed bacterial organisms suggestive of contamination. She was given a course of Cipro for five days. She had no further fevers. She finished the Cipro but approximately two days later she developed warmth, tenderness, and redness over the left breast. She was admitted to the hospital yesterday and started on vancomycin and levofloxacin. Her white count was 11.9 today. She denies any further fevers, chills, or sweats. Since being on the antibiotics, the redness has decreased somewhat as has the swelling in the left breast. The right breast has had no changes. She has not had any nausea, vomiting, no change in her bowels, and no change in her urine. Her initial urine symptoms have resolved.   ALLERGIES: No known drug allergies.   PAST MEDICAL HISTORY:  1. Diabetes.  2. Thrombocytosis for which she takes Coumadin.  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 She underwent bilateral mastectomies in November of 2012. She subsequently underwent breast reconstruction on 10/31/2011. She had  been doing well until the current episode.   FAMILY HISTORY: Positive for multiple myeloma and diabetes.   SOCIAL HISTORY: The patient lives with her husband. She works as an Mining engineer at [employer].   REVIEW OF SYSTEMS: GENERAL: Positive fever about 10 days ago with chills, sweats, and malaise. No further similar symptoms since then. HEENT: No headache. No sinus congestion. No sore throat. NECK: No stiffness. No swollen glands. RESPIRATORY: No cough. No shortness of breath. No sputum production. CARDIAC: No chest pain. No palpitations. No peripheral edema. GI: No nausea, no vomiting, no abdominal pain, and no change in her bowels. GENITOURINARY: No change in her urine. MUSCULOSKELETAL: No complaints. SKIN: She has redness, warmth, and swelling of the left breast. There has been no drainage from the prior surgical wound. The right breast has been within normal limits. She has had no recent bleeding. NEUROLOGIC: No focal weakness. PSYCHIATRIC: No complaints. All other systems are negative.   PHYSICAL EXAMINATION:   VITAL SIGNS: T-max 98.7, T-current 97.6, pulse 73, blood pressure 110/67, and 96% on room air.   GENERAL: A  69 year old white female in no acute distress.   HEENT: Normocephalic, atraumatic. Pupils equal and reactive to light. Extraocular motion intact. Sclerae appeared to be without evidence for emboli or petechiae.   NECK: Midline trachea. No apparent lymphadenopathy. Full range of motion.   LUNGS: Clear to auscultation bilaterally with good air movement. No focal consolidation.   HEART: Regular rate and rhythm without murmur, rub, or gallop.   ABDOMEN: Soft, nontender, and nondistended. No hepatosplenomegaly. No hernia is noted.   EXTREMITIES: No evidence for  tenosynovitis.   SKIN: She had erythema along the left breast surgical margin. There was no evidence for drainage. There was no dehiscence of the wound. The wound was edematous as well. There was some edema extending laterally  without redness. There were no abnormalities in the right breast. The area was slightly warm to touch on the left. She had no other areas of rash.   NEUROLOGIC: The patient was awake and interactive, moving all four extremities.   PSYCHIATRIC: Mood and affect appeared normal.   LABORATORY DATA: BUN 9, creatinine 0.75, bicarbonate 28, and anion gap 8. White count 11.9 with a hemoglobin of 13.2 and platelet count of 382 and ANC of 8.8.  No blood cultures were obtained on admission.   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   RECOMMENDATIONS:  1. I do not think she actually had a urinary tract infection to explain her fever in the week preceding her development of cellulitis. Her urinalysis shows no nitrites, no leukocyte esterase, and no white cells in the urine. Her culture had mixed flora. I suspect that the fever was related to her current infection.  2. The current issue is whether her implant is infected or whether this is just superficial cellulitis. A 2012 study of breast implant infection cited 5.1% risk per patient over two years of developing implant infection after 195 women in the study who had reconstruction procedures from a single institution. They found that the most highly associated factor was overlying cellulitis. The odds ratio was 200 times greater if there was cellulitis present.  3. She appears to be doing better currently. As Staph and Strep are the most common causes of infection (with MRSA also being possible), would focus on treating these organisms. We will continue the vancomycin. We will discontinue to fluoroquinolone.  4. If she continues to do well then would change her to doxycycline 100 mg p.o. twice a day over the next day or so. I would plan on treating for 10 days. If she worsens or if after 10  days of oral therapy she recurs then would presume the underlying implant was infected and would remove it. Would then plan on treating based on culture data.  5. We will follow her CBC.  6. I will plan on seeing her in follow-up after her treatment course is done as an outpatient.           This is a moderately complex infectious disease case. Thank you very much for involving me in Ms. Tetterton' care. ____________________________ Heinz Knuckles. Aynsley Fleet, MD meb:slb D: 12/27/2011 16:25:44 ET T: 12/27/2011 17:00:11 ET JOB#: [id number]  cc: Heinz Knuckles. Peace Noyes, MD, <Dictator> Firman Petrow E Leeland Lovelady MD ELECTRONICALLY SIGNED 12/30/2011 8:21

## 2014-09-20 NOTE — Discharge Summary (Signed)
PATIENT NAME:  Lorraine Miller, Lorraine Miller MR#:  [id number] DATE OF BIRTH:  06/06/1945  DATE OF ADMISSION:  12/26/2011 DATE OF DISCHARGE:  12/31/2011  FINAL DIAGNOSIS: Cellulitis of left breast dissection and infection of left tissue expander.   PRINCIPLE PROCEDURES:  1. Intravenous antibiotics.  2. Infectious disease consultation. 3. Removal of implant by Dr. Tula Nakayama on the 28th of July. 4. Intravenous antibiotics.   HOSPITAL COURSE SUMMARY: The patient was admitted by Dr. Tula Nakayama from his office with cellulitis of her left breast implant. She was treated with intravenous vancomycin and levofloxacin. She had some improvement regarding the cellulitis. Dr. Tula Nakayama then saw her on the 28th of July after she had minimal improvement of her left chest cellulitis and after he discussed the options with her it was elected to remove the left breast tissue expander. She did well with this and cellulitis had nearly resolved by the time that she was discharged, remained afebrile on oral antibiotics and was discharged home in stable condition on 30th of July.   DISCHARGE MEDICATIONS:  1. Metformin 500 mg by mouth b.i.d.  2. Diazepam 5 mg by mouth as needed after supper. 3. Lasix 20 mg once a day. 4. Magnesium 250 mg by mouth once a day.  5. Citalopram 40 mg by mouth q.a.m.  6. Lovastatin 20 mg by mouth once a day.  7. Norco 5/325, 1 to 2 tabs p.o. q.4-6 hours p.r.n. pain. 8. Clindamycin 100 mg by mouth b.i.d.       DISCHARGE INSTRUCTIONS: She was instructed on drain care and she is familiar with this from previous mastectomies. She will call Dr. Edwin Dada office in the morning and arrange follow up with him.   ____________________________ Lorraine How. Marina Gravel, Lorraine Miller mab:cms D: 01/07/2012 21:17:36 ET T: 01/08/2012 13:31:57 ET JOB#: [id number]  cc: Elta Guadeloupe A. Marina Gravel, Lorraine Miller, <Dictator> Cleda Daub, Lorraine Miller Helen Cuff Bettina Gavia Lorraine Miller ELECTRONICALLY SIGNED 01/09/2012 9:38

## 2014-09-20 NOTE — Consult Note (Signed)
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  I do not think that she actually had a UTI to explain her fever in the week preceding her development of cellulitis.  Her u/a showed no nitrites, no leukocyte esterase and no WBCs in the urine.  Her culture had mixed flora.  I suspect that the fever was related to her current infection. The current issue is whether her implant is infected or whether this is just superficial cellulitis.  A 2012 study of breast implant infections cited 5.1% risk per patient over two years of developing implant infection in 39 women who had reconstruction procedures in a single institution.  They found that the most highly associated factor was overlying cellulitis.  The odds ratio was 200x greater if there was cellulitis present. She appears to be doing better currently.  As Staph and Strep spp are the most common cause of infection (with Methacillin Resistant Staph aureus being possible), would focus on treating this.  Will continue the vanco.  Will d/c the fluoroquinolone. If she continues to do well, then would change her to doxcycyline 100mg  po bid over the next day or so.  Would treat for 10 days.  If she worsens or if after 10 days of oral therapy, she recurs, then would presume the underlying implant was infected and would remove it.  We would then plan on treating based on culture data. 7) Would follow her CBC.hey are unlikely to be positive. Would follow her CBC. I will see her in two weeks as an outpt.   Electronic Signatures: Tomorrow Dehaas, Heinz Knuckles (MD) (Signed on 26-Jul-13 16:15)  Authored   Last Updated: 26-Jul-13 16:25 by Adolph Clutter, Heinz Knuckles (MD)

## 2014-09-20 NOTE — H&P (Signed)
PATIENT NAME:  Lorraine Miller, PRINTY MR#:  [id number] DATE OF BIRTH:  November 27, 1945  DATE OF ADMISSION:  12/26/2011  CHIEF COMPLAINT: Inflamed left breast status post implant-based reconstruction bilateral breasts eight weeks ago.   HISTORY OF PRESENT ILLNESS: Lorraine Miller is a very pleasant 69 year old female who underwent uneventful delayed breast reconstruction with tissue expanders and acellular dermis eight weeks ago. This has been uncomplicated and she has done well with regard to her reconstruction. Ten days prior to admission she was found to have a fever and chills but no other signs of infection in either breast. She had no tenderness. However, she did have symptoms of urinary distress, dysuria, urgency, and a urine screen was performed which did not show evidence of infection but she was placed on ciprofloxacin and her symptoms resolved. After two days her fever was gone and she felt well. At the completion of her five day course she was asymptomatic. She stopped her antibiotics after five days and three days later she presented to my office without fever or chills but with inflammatory changes in her left breast inferior pole flap. She had redness and edema. She had no other complaints.   PAST MEDICAL HISTORY:  1. Diabetes mellitus controlled with metformin. 2. Thrombocytosis treated with Coumadin  3. Depression treated with citalopram. 4. Hypercholesterolemia.   PAST SURGICAL HISTORY:  1. Bilateral mastectomy in November 2012. 2. Appendectomy at age of 72. 17. Bilateral tissue-based expander reconstruction in May 2013.   PHYSICAL EXAMINATION:   HEENT: The patient is normocephalic, atraumatic, alert and oriented x4.   HEART: Heart rate is regular rate and rhythm.   LUNGS: Lungs are clear to auscultation.   MUSCULOSKELETAL: The patient is ambulatory without difficulty. Normal musculoskeletal exam.   BREASTS: Examination of the breasts demonstrates no evidence of infection, swelling, or wound  complications on the right. On the left, however, the lower pole has edema, 2+ pitting cellulitis and redness but the incision is intact and there is no evidence of fluctuance or fluid.   REVIEW OF SYSTEMS: Review of systems is negative other than the redness in her left breast. 10 system review of systems was reviewed with the patient.   ASSESSMENT AND PLAN: The patient was admitted with the assistance of the surgical team and a consult for Infectious Disease was requested of Dr. Clayborn Bigness. She was placed on vancomycin and Levaquin. Plan of care was to observe for 72 hours with the possibility of removing the implant. With the goal of trying to salvage the implant she will be watched. The plan of care has been reviewed with the patient thoroughly. All of her questions have been answered.   ____________________________ Cleda Daub, MD bsc:drc D: 12/28/2011 16:11:35 ET T: 12/28/2011 16:18:31 ET JOB#: [id number]  cc: Cleda Daub, MD, <Dictator> Cleda Daub MD ELECTRONICALLY SIGNED 12/29/2011 13:36

## 2014-09-24 NOTE — Op Note (Signed)
PATIENT NAME:  Lorraine Miller, Lorraine Miller MR#:  [id number] DATE OF BIRTH:  Oct 01, 1945  DATE OF PROCEDURE:  12/27/2013  OPERATION PERFORMED: Right inferior parathyroidectomy.   SURGEON: Consuela Mimes, M.D.   FIRST ASSISTANT: Dr. Marta Lamas.   PREOPERATIVE DIAGNOSIS: Primary hyperparathyroidism.   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   ANESTHESIA:  General.   PROCEDURE IN DETAIL: The patient was placed supine on the operating room table and prepped and draped in the usual sterile fashion. An incision was made through the patient's previous right neck incision (from her anterior cervical fusion), and this was carried down through the subcutaneous tissue and the platysma muscle and subplatysmal flaps were created superiorly and inferiorly.  The anterior border of the sternocleidomastoid muscle was dissected and retracted laterally. A branch off the internal jugular vein was divided and the jugular vein venotomy was closed with a figure-of-eight 3-0 silk suture. The ansa cervicalis was identified and divided with the electrocautery and dissection to the lateral aspect of the strap muscles was achieved and these were rotated medially until the lower pole of the right thyroid gland could be approached. Dissection within this area revealed 2 small lymph nodes.  They were not thought to be parathyroid glands but were sent for frozen section and indeed returned as lymph nodes.   The parathyroid adenoma was found under the capsule of the thyroid gland inferiorly and was dissected free of thyroid tissue as it also had its own separate capsule. This was sent for frozen section diagnosis and returned a 700 mg hypercellular parathyroid gland. Two additional small lymph nodes near the mediastinum were left in the neck as was the thymus gland which was left untouched.   The recurrent laryngeal nerve was never identified but also was not harmed  during the procedure. Since hemostasis was excellent I reclosed the intermediate fascia of the neck with 2 horizontal mattress sutures of 3-0 Monocryl and then closed the platysma with a running 3-0 Monocryl closure and closed the skin with a running subcuticular 5-0 Monocryl and suture strips.   The patient tolerated the procedure well. There were no complications.    ____________________________ Consuela Mimes, MD wfm:lt D: 12/27/2013 09:20:21 ET T: 12/27/2013 10:04:18 ET JOB#: [id number]  cc: Consuela Mimes, MD, <Dictator> A. Lavone Orn, MD Consuela Mimes MD ELECTRONICALLY SIGNED 12/27/2013 18:33

## 2014-09-25 NOTE — Op Note (Signed)
PATIENT NAME:  Lorraine Miller, Lorraine Miller MR#:  [id number] DATE OF BIRTH:  1946/05/28  DATE OF PROCEDURE:  10/31/2011  PREOPERATIVE DIAGNOSIS: Absent right and left breasts (status post bilateral mastectomies).   POSTOPERATIVE DIAGNOSIS: Absent right and left breasts (status post bilateral mastectomies).  PROCEDURE: Bilateral delayed breast reconstruction with tissue expander and acellular dermal matrix placement.  SURGEON: Cleda Daub, MD    ANESTHESIA: General endotracheal.   ESTIMATED BLOOD LOSS: 25 mL.   COMPLICATIONS: None.   FINDINGS: Good symmetry and implant position at completion of procedure with well perfused tissue flaps.   FINAL FILL VOLUME ON BOTH SIDES: 250 mL.  EXPANDER TYPE: 133SX-13   STATEMENT OF NECESSITY: Lorraine Miller is an otherwise healthy 69 year old female who had a history of bilateral mastectomies. This operative note is dictated secondarily as the initial one was lost by the dictation service. This is dictated from memory. The original one was dictated date of surgery. The patient has requested delayed breast reconstruction. She is unhappy with contour irregularities. The risks, benefits, and alternatives including, but not limited to, persistent contour irregularities, visible scars, absent nipples, need for secondary procedures, were discussed with her and she has requested the procedure as described. '  STATEMENT OF PROCEDURE: The patient was brought to the operating room awake, alert, and comfortable and placed on the OR table in the supine position with both arms outstretched. After adequate anesthesia had been achieved, both arms were outstretched. Bony prominences were padded. SCDs were on and functioning and the patient's chest was prepped and draped in the usual sterile fashion.   Directing attention first towards the right side, the mastectomy scar was opened sharply and subcutaneous flaps were elevated in a limited extent superiorly and inferiorly. Care was taken to  preserve the inframammary fold attachments. Pectoralis fibers were identified and under direct visualization the attachments along the rib were divided up to the sternal margin. A small pocket was then created underneath the pectoralis muscle and prospective hemostasis was maintained using a lighted retractor. Lateral dissection continued between pec major and pec minor until a space 13 cm in width and appropriate for the expander had been developed. The space was copiously irrigated and at this point a well hydrated piece of acellular dermal matrix was introduced. It was 16 cm x 8 which was divided into 16 x 4 and used on both sides. This piece was then tacked along the inframammary fold using a mattress-type suture connecting three particular places, the inframammary flap, the acellular dermal matrix, and the costal margin. This created a tight pocket at the inferior margin of the breast. This was performed all along the bottom of the inframammary crease using 0 PDS sutures. Next, using a 2-0 Vicryl the acellular dermis was pexied to the inferior edge of the pectoralis muscle creating a sling and pocket. This was performed first medially towards the center portion of the breast and laterally towards the central portion of the breast leaving a small opening for the expander to be placed. The wound was copiously irrigated and inspected for hemostasis. Triple antibiotic irrigation was used. The expander was prepared by aspirating all air and introducing 1 mL of methylene blue. A 15 [place] Blake drain was brought out through a stab incision laterally and secured into place and placed underneath the subcutaneous space and also in the subpectoral space. At this point, the expander was placed in the subpectoral plane. The suture line between the pack and the acellular dermis was completed and then the  tissue was redraped over top with minimal tension and closed with interrupted 3-0 Vicryl and 4-0 subcuticular Monocryl  sutures. Percutaneous identification of the port was performed and accessed. 250 mL was introduced without difficulty and without undue tension on the overlying skin. At this point, attention was directed towards the contralateral side. An absolutely identical procedure was performed on the contralateral side. We used a 133SX-13 expander and a 4 x 16 piece of acellular dermis. She also had 250 mL placed in this side. All wounds were then covered with Dermabond, Steri-Strips, and sterile dressing.   Needle, sponge, and lap counts were correct. She was placed in a lightly compressive garment. She was awakened smoothly from anesthesia and transferred to recovery in good condition.      Please note that this was dictated in a delayed fashion due to a lost dictation.    ____________________________ Cleda Daub, MD bsc:drc D: 11/07/2011 10:05:52 ET T: 11/07/2011 10:17:21 ET JOB#: [id number]  cc: Cleda Daub, MD, <Dictator> Cleda Daub MD ELECTRONICALLY SIGNED 11/08/2011 15:19

## 2015-01-06 ENCOUNTER — Encounter: Payer: Self-pay | Admitting: Family

## 2015-01-06 ENCOUNTER — Ambulatory Visit (INDEPENDENT_AMBULATORY_CARE_PROVIDER_SITE_OTHER): Payer: PPO | Admitting: Family

## 2015-01-06 VITALS — BP 110/74 | HR 76 | Temp 98.1°F | Resp 18 | Ht 66.0 in | Wt 156.1 lb

## 2015-01-06 DIAGNOSIS — J449 Chronic obstructive pulmonary disease, unspecified: Secondary | ICD-10-CM | POA: Diagnosis not present

## 2015-01-06 DIAGNOSIS — G542 Cervical root disorders, not elsewhere classified: Secondary | ICD-10-CM

## 2015-01-06 MED ORDER — PREDNISONE 10 MG PO TABS: ORAL_TABLET | ORAL | Status: DC

## 2015-01-06 NOTE — Assessment & Plan Note (Signed)
Stable with current regimen of albuterol, Symbicort, and Spiriva. Continue current dosages of the same. Follow up if symptoms are no longer controlled with current regimen.

## 2015-01-06 NOTE — Progress Notes (Signed)
Pre visit review using our clinic review tool, if applicable. No additional management support is needed unless otherwise documented below in the visit note. 

## 2015-01-06 NOTE — Patient Instructions (Addendum)
Thank you for choosing [facility].  Summary/Instructions:   6 Day Prednisone Taper Instructions:   Day 1: Two tablets before breakfast, one after lunch, one after dinner, and two at bedtime.  Day 2: One tablet before breakfast, one after lunch, one after dinner, and two at bedtime Day 3: One tablet before breakfast, one after lunch, one after dinner, and one at bedtime Day 4: One tablet before breakfast, one after lunch, and one at bedtime Day 5: One tablet before breakfast and one at bedtime Day 6: One tablet before breakfast Your prescription(s) have been submitted to your pharmacy or been printed and provided for you. Please take as directed and contact our office if you believe you are having problem(s) with the medication(s) or have any questions.  If your symptoms worsen or fail to improve, please contact our office for further instruction, or in case of emergency go directly to the emergency room at the closest medical facility.   Cervical Strain and Sprain (Whiplash) with Rehab Cervical strain and sprain are injuries that commonly occur with "whiplash" injuries. Whiplash occurs when the neck is forcefully whipped backward or forward, such as during a motor vehicle accident or during contact sports. The muscles, ligaments, tendons, discs, and nerves of the neck are susceptible to injury when this occurs. RISK FACTORS Risk of having a whiplash injury increases if:  Osteoarthritis of the spine.  Situations that make head or neck accidents or trauma more likely.  High-risk sports (football, rugby, wrestling, hockey, auto racing, gymnastics, diving, contact karate, or boxing).  Poor strength and flexibility of the neck.  Previous neck injury.  Poor tackling technique.  Improperly fitted or padded equipment. SYMPTOMS   Pain or stiffness in the front or back of neck or both.  Symptoms may present immediately or up to 24 hours after injury.  Dizziness, headache,  nausea, and vomiting.  Muscle spasm with soreness and stiffness in the neck.  Tenderness and swelling at the injury site. PREVENTION  Learn and use proper technique (avoid tackling with the head, spearing, and head-butting; use proper falling techniques to avoid landing on the head).  Warm up and stretch properly before activity.  Maintain physical fitness:  Strength, flexibility, and endurance.  Cardiovascular fitness.  Wear properly fitted and padded protective equipment, such as padded soft collars, for participation in contact sports. PROGNOSIS  Recovery from cervical strain and sprain injuries is dependent on the extent of the injury. These injuries are usually curable in 1 week to 3 months with appropriate treatment.  RELATED COMPLICATIONS   Temporary numbness and weakness may occur if the nerve roots are damaged, and this may persist until the nerve has completely healed.  Chronic pain due to frequent recurrence of symptoms.  Prolonged healing, especially if activity is resumed too soon (before complete recovery). TREATMENT  Treatment initially involves the use of ice and medication to help reduce pain and inflammation. It is also important to perform strengthening and stretching exercises and modify activities that worsen symptoms so the injury does not get worse. These exercises may be performed at home or with a therapist. For patients who experience severe symptoms, a soft, padded collar may be recommended to be worn around the neck.  Improving your posture may help reduce symptoms. Posture improvement includes pulling your chin and abdomen in while sitting or standing. If you are sitting, sit in a firm chair with your buttocks against the back of the chair. While sleeping, try replacing your pillow with a  small towel rolled to 2 inches in diameter, or use a cervical pillow or soft cervical collar. Poor sleeping positions delay healing.  For patients with nerve root damage,  which causes numbness or weakness, the use of a cervical traction apparatus may be recommended. Surgery is rarely necessary for these injuries. However, cervical strain and sprains that are present at birth (congenital) may require surgery. MEDICATION   If pain medication is necessary, nonsteroidal anti-inflammatory medications, such as aspirin and ibuprofen, or other minor pain relievers, such as acetaminophen, are often recommended.  Do not take pain medication for 7 days before surgery.  Prescription pain relievers may be given if deemed necessary by your caregiver. Use only as directed and only as much as you need. HEAT AND COLD:   Cold treatment (icing) relieves pain and reduces inflammation. Cold treatment should be applied for 10 to 15 minutes every 2 to 3 hours for inflammation and pain and immediately after any activity that aggravates your symptoms. Use ice packs or an ice massage.  Heat treatment may be used prior to performing the stretching and strengthening activities prescribed by your caregiver, physical therapist, or athletic trainer. Use a heat pack or a warm soak. SEEK MEDICAL CARE IF:   Symptoms get worse or do not improve in 2 weeks despite treatment.  New, unexplained symptoms develop (drugs used in treatment may produce side effects). EXERCISES RANGE OF MOTION (ROM) AND STRETCHING EXERCISES - Cervical Strain and Sprain These exercises may help you when beginning to rehabilitate your injury. In order to successfully resolve your symptoms, you must improve your posture. These exercises are designed to help reduce the forward-head and rounded-shoulder posture which contributes to this condition. Your symptoms may resolve with or without further involvement from your physician, physical therapist or athletic trainer. While completing these exercises, remember:   Restoring tissue flexibility helps normal motion to return to the joints. This allows healthier, less painful  movement and activity.  An effective stretch should be held for at least 20 seconds, although you may need to begin with shorter hold times for comfort.  A stretch should never be painful. You should only feel a gentle lengthening or release in the stretched tissue. STRETCH- Axial Extensors  Lie on your back on the floor. You may bend your knees for comfort. Place a rolled-up hand towel or dish towel, about 2 inches in diameter, under the part of your head that makes contact with the floor.  Gently tuck your chin, as if trying to make a "double chin," until you feel a gentle stretch at the base of your head.  Hold __________ seconds. Repeat __________ times. Complete this exercise __________ times per day.  STRETCH - Axial Extension   Stand or sit on a firm surface. Assume a good posture: chest up, shoulders drawn back, abdominal muscles slightly tense, knees unlocked (if standing) and feet hip width apart.  Slowly retract your chin so your head slides back and your chin slightly lowers. Continue to look straight ahead.  You should feel a gentle stretch in the back of your head. Be certain not to feel an aggressive stretch since this can cause headaches later.  Hold for __________ seconds. Repeat __________ times. Complete this exercise __________ times per day. STRETCH - Cervical Side Bend   Stand or sit on a firm surface. Assume a good posture: chest up, shoulders drawn back, abdominal muscles slightly tense, knees unlocked (if standing) and feet hip width apart.  Without letting your nose  or shoulders move, slowly tip your right / left ear to your shoulder until your feel a gentle stretch in the muscles on the opposite side of your neck.  Hold __________ seconds. Repeat __________ times. Complete this exercise __________ times per day. STRETCH - Cervical Rotators   Stand or sit on a firm surface. Assume a good posture: chest up, shoulders drawn back, abdominal muscles slightly  tense, knees unlocked (if standing) and feet hip width apart.  Keeping your eyes level with the ground, slowly turn your head until you feel a gentle stretch along the back and opposite side of your neck.  Hold __________ seconds. Repeat __________ times. Complete this exercise __________ times per day. RANGE OF MOTION - Neck Circles   Stand or sit on a firm surface. Assume a good posture: chest up, shoulders drawn back, abdominal muscles slightly tense, knees unlocked (if standing) and feet hip width apart.  Gently roll your head down and around from the back of one shoulder to the back of the other. The motion should never be forced or painful.  Repeat the motion 10-20 times, or until you feel the neck muscles relax and loosen. Repeat __________ times. Complete the exercise __________ times per day. STRENGTHENING EXERCISES - Cervical Strain and Sprain These exercises may help you when beginning to rehabilitate your injury. They may resolve your symptoms with or without further involvement from your physician, physical therapist, or athletic trainer. While completing these exercises, remember:   Muscles can gain both the endurance and the strength needed for everyday activities through controlled exercises.  Complete these exercises as instructed by your physician, physical therapist, or athletic trainer. Progress the resistance and repetitions only as guided.  You may experience muscle soreness or fatigue, but the pain or discomfort you are trying to eliminate should never worsen during these exercises. If this pain does worsen, stop and make certain you are following the directions exactly. If the pain is still present after adjustments, discontinue the exercise until you can discuss the trouble with your clinician. STRENGTH - Cervical Flexors, Isometric  Face a wall, standing about 6 inches away. Place a small pillow, a ball about 6-8 inches in diameter, or a folded towel between your  forehead and the wall.  Slightly tuck your chin and gently push your forehead into the soft object. Push only with mild to moderate intensity, building up tension gradually. Keep your jaw and forehead relaxed.  Hold 10 to 20 seconds. Keep your breathing relaxed.  Release the tension slowly. Relax your neck muscles completely before you start the next repetition. Repeat __________ times. Complete this exercise __________ times per day. STRENGTH- Cervical Lateral Flexors, Isometric   Stand about 6 inches away from a wall. Place a small pillow, a ball about 6-8 inches in diameter, or a folded towel between the side of your head and the wall.  Slightly tuck your chin and gently tilt your head into the soft object. Push only with mild to moderate intensity, building up tension gradually. Keep your jaw and forehead relaxed.  Hold 10 to 20 seconds. Keep your breathing relaxed.  Release the tension slowly. Relax your neck muscles completely before you start the next repetition. Repeat __________ times. Complete this exercise __________ times per day. STRENGTH - Cervical Extensors, Isometric   Stand about 6 inches away from a wall. Place a small pillow, a ball about 6-8 inches in diameter, or a folded towel between the back of your head and the wall.  Slightly tuck your chin and gently tilt your head back into the soft object. Push only with mild to moderate intensity, building up tension gradually. Keep your jaw and forehead relaxed.  Hold 10 to 20 seconds. Keep your breathing relaxed.  Release the tension slowly. Relax your neck muscles completely before you start the next repetition. Repeat __________ times. Complete this exercise __________ times per day. POSTURE AND BODY MECHANICS CONSIDERATIONS - Cervical Strain and Sprain Keeping correct posture when sitting, standing or completing your activities will reduce the stress put on different body tissues, allowing injured tissues a chance to  heal and limiting painful experiences. The following are general guidelines for improved posture. Your physician or physical therapist will provide you with any instructions specific to your needs. While reading these guidelines, remember:  The exercises prescribed by your provider will help you have the flexibility and strength to maintain correct postures.  The correct posture provides the optimal environment for your joints to work. All of your joints have less wear and tear when properly supported by a spine with good posture. This means you will experience a healthier, less painful body.  Correct posture must be practiced with all of your activities, especially prolonged sitting and standing. Correct posture is as important when doing repetitive low-stress activities (typing) as it is when doing a single heavy-load activity (lifting). PROLONGED STANDING WHILE SLIGHTLY LEANING FORWARD When completing a task that requires you to lean forward while standing in one place for a long time, place either foot up on a stationary 2- to 4-inch high object to help maintain the best posture. When both feet are on the ground, the low back tends to lose its slight inward curve. If this curve flattens (or becomes too large), then the back and your other joints will experience too much stress, fatigue more quickly, and can cause pain.  RESTING POSITIONS Consider which positions are most painful for you when choosing a resting position. If you have pain with flexion-based activities (sitting, bending, stooping, squatting), choose a position that allows you to rest in a less flexed posture. You would want to avoid curling into a fetal position on your side. If your pain worsens with extension-based activities (prolonged standing, working overhead), avoid resting in an extended position such as sleeping on your stomach. Most people will find more comfort when they rest with their spine in a more neutral position, neither  too rounded nor too arched. Lying on a non-sagging bed on your side with a pillow between your knees, or on your back with a pillow under your knees will often provide some relief. Keep in mind, being in any one position for a prolonged period of time, no matter how correct your posture, can still lead to stiffness. WALKING Walk with an upright posture. Your ears, shoulders, and hips should all line up. OFFICE WORK When working at a desk, create an environment that supports good, upright posture. Without extra support, muscles fatigue and lead to excessive strain on joints and other tissues. CHAIR:  A chair should be able to slide under your desk when your back makes contact with the back of the chair. This allows you to work closely.  The chair's height should allow your eyes to be level with the upper part of your monitor and your hands to be slightly lower than your elbows.  Body position:  Your feet should make contact with the floor. If this is not possible, use a foot rest.  Keep your  ears over your shoulders. This will reduce stress on your neck and low back. Document Released: 05/20/2005 Document Revised: 10/04/2013 Document Reviewed: 09/01/2008 [facility] Patient Information 2015 [place], [place]. This information is not intended to replace advice given to you by your health care provider. Make sure you discuss any questions you have with your health care provider.

## 2015-01-06 NOTE — Progress Notes (Signed)
Subjective:    Patient ID: Lorraine Miller, female    DOB: 02-06-1946, 69 y.o.   MRN: [id number]  Chief Complaint  Patient presents with  . Establish Care    had cervical discectomy, had had alot of pain lately through her neck down to where her bra fastens, described as a burning sensation, feels like joint in her left hip pops out of place at times     HPI:  Lorraine Miller is a 69 y.o. female with a PMH of arthritis, breast cancer, COPD, depression, type 2 diabetes, ACDF, hysterectomy, and appendectomy who presents today for an office visit to establish care.  1.) Cervical pain - Associated symptom of pain located in her cervical spine has been going on for 8 years. She has had a previous history of ACDF. Described as a burning pain and located from neck down to about her bra strap. Timing of the symptoms is worse in the afternoons and as the day progressives. Modifying factors include advil or Aleve. Severity of the symptoms are enough to wake her up at night and for her not to tolerate the her bra strap. Denies numbness or tingling. Notes that she has a slight decrease in strength on the right side which has been consistent with her spinal cord. Other modifying factors include heating pack which has helped a little.   2.) COPD - Stable with current dosage of albuterol, Spiriva, and Symbicort. Takes the medication as prescribed and denies adverse side effects.    Allergies  Allergen Reactions  . Abilify [Aripiprazole]   . Ambien [Zolpidem Tartrate]     Had really bad nightmares  . Gabapentin     In a fog and could not function     No outpatient prescriptions prior to visit.   No facility-administered medications prior to visit.     Past Medical History  Diagnosis Date  . Diabetes mellitus without complication   . Arthritis   . Cancer     Breast cancer  . Depression   . COPD (chronic obstructive pulmonary disease)   . Colon polyp      Past Surgical History  Procedure  Laterality Date  . Appendectomy    . Tonsillectomy    . Breast lumpectomy    . Abdominal hysterectomy    . Anterior cervical discectomy    . Posterior cervical fusion/foraminotomy    . Surgical breast biopsy    . Double mastectomy    . Parathyroidectomy       Family History  Problem Relation Age of Onset  . Multiple myeloma Mother   . Heart disease Father   . Diabetes Father   . Healthy Maternal Grandmother   . Healthy Maternal Grandfather   . Stomach cancer Paternal Grandmother   . Diabetes Paternal Grandfather   . Healthy Paternal Grandfather      History   Social History  . Marital Status: Married    Spouse Name: N/A  . Number of Children: 2  . Years of Education: 14   Occupational History  . Not on file.   Social History Main Topics  . Smoking status: Former Smoker -- 0.25 packs/day for 28 years    Types: Cigarettes    Quit date: 06/03/2001  . Smokeless tobacco: Not on file  . Alcohol Use: No  . Drug Use: No  . Sexual Activity: Not on file   Other Topics Concern  . Not on file   Social History Narrative   Fun:  Paint and crafts   Denies religious beliefs effecting health care.   Denies abuse and feels safe at home.      Review of Systems  Respiratory: Negative for cough, chest tightness and shortness of breath.   Musculoskeletal: Positive for neck pain and neck stiffness.  Neurological: Positive for weakness. Negative for numbness.      Objective:    BP 110/74 mmHg  Pulse 76  Temp(Src) 98.1 F (36.7 C) (Oral)  Resp 18  Ht '5\' 6"'  (1.676 m)  Wt 156 lb 1.9 oz (70.816 kg)  BMI 25.21 kg/m2  SpO2 96% Nursing note and vital signs reviewed.  Physical Exam  Constitutional: She is oriented to person, place, and time. She appears well-developed and well-nourished. No distress.  Neck:  Well-healed cervical spinal incision noted. No obvious deformity, discoloration, or edema. Significant stiffness and muscle tightness present. Flexion and extension  are normal. Lateral bending and rotation are significantly limited. There is slight decrease in strength on the right side compared to the left. Distal pulses, sensation, and reflexes are intact and appropriate.  Cardiovascular: Normal rate, regular rhythm, normal heart sounds and intact distal pulses.   Pulmonary/Chest: Effort normal and breath sounds normal.  Neurological: She is alert and oriented to person, place, and time.  Skin: Skin is warm and dry.  Psychiatric: She has a normal mood and affect. Her behavior is normal. Judgment and thought content normal.       Assessment & Plan:   Problem List Items Addressed This Visit      Respiratory   COPD (chronic obstructive pulmonary disease)    Stable with current regimen of albuterol, Symbicort, and Spiriva. Continue current dosages of the same. Follow up if symptoms are no longer controlled with current regimen.       Relevant Medications   budesonide-formoterol (SYMBICORT) 80-4.5 MCG/ACT inhaler   tiotropium (SPIRIVA) 18 MCG inhalation capsule   albuterol (PROVENTIL HFA) 108 (90 BASE) MCG/ACT inhaler   predniSONE (DELTASONE) 10 MG tablet     Nervous and Auditory   Neuropathy, cervical - Primary    Symptoms and exam consistent with cervical neuropathy most likely related to degenerative disc disease. Start prednisone to decrease inflammation. Discussed possible need for medications to decrease neuropathic pain. Home exercise program initiated with exercise provided to patient. Continue over-the-counter medications as needed for symptom relief and supportive care. Follow-up in one month to determine effectiveness of interventions.      Relevant Medications   citalopram (CELEXA) 40 MG tablet   predniSONE (DELTASONE) 10 MG tablet

## 2015-01-06 NOTE — Assessment & Plan Note (Signed)
Symptoms and exam consistent with cervical neuropathy most likely related to degenerative disc disease. Start prednisone to decrease inflammation. Discussed possible need for medications to decrease neuropathic pain. Home exercise program initiated with exercise provided to patient. Continue over-the-counter medications as needed for symptom relief and supportive care. Follow-up in one month to determine effectiveness of interventions.

## 2015-02-07 ENCOUNTER — Encounter: Payer: Self-pay | Admitting: Family

## 2015-02-07 ENCOUNTER — Ambulatory Visit (INDEPENDENT_AMBULATORY_CARE_PROVIDER_SITE_OTHER): Payer: PPO | Admitting: Family

## 2015-02-07 VITALS — BP 120/70 | HR 80 | Temp 98.3°F | Resp 18 | Ht 66.0 in | Wt 155.0 lb

## 2015-02-07 DIAGNOSIS — G479 Sleep disorder, unspecified: Secondary | ICD-10-CM | POA: Diagnosis not present

## 2015-02-07 DIAGNOSIS — Z23 Encounter for immunization: Secondary | ICD-10-CM | POA: Diagnosis not present

## 2015-02-07 DIAGNOSIS — G542 Cervical root disorders, not elsewhere classified: Secondary | ICD-10-CM

## 2015-02-07 DIAGNOSIS — F411 Generalized anxiety disorder: Secondary | ICD-10-CM | POA: Diagnosis not present

## 2015-02-07 MED ORDER — CLONAZEPAM 0.5 MG PO TABS: 0.2500 mg | ORAL_TABLET | Freq: Two times a day (BID) | ORAL | Status: DC | PRN

## 2015-02-07 NOTE — Assessment & Plan Note (Signed)
Continues to experience neck pain, however the radiculopathy has improved with the prednisone taper and exercises. Continue current home exercise regimen and follow-up if symptoms worsen.

## 2015-02-07 NOTE — Progress Notes (Signed)
Pre visit review using our clinic review tool, if applicable. No additional management support is needed unless otherwise documented below in the visit note. 

## 2015-02-07 NOTE — Assessment & Plan Note (Signed)
Continues to experience insomnia sleeping approximately 2 hours per evening. Previously failed attempts with Ambien and had some success with trazodone. Not currently taking any medications. Would like to hold off on medications at this time. Discussed importance of adequate sleep and appropriate sleep hygiene. Follow up if symptoms worsen.

## 2015-02-07 NOTE — Progress Notes (Signed)
Subjective:    Patient ID: Lorraine Miller, female    DOB: 1945-12-17, 69 y.o.   MRN: [id number]  Chief Complaint  Patient presents with  . Follow-up    she states that the prednisone helps with the burning pain she was having in her back, states she is getting a short fuse, isomnia    HPI:  Lorraine Miller is a 69 y.o. female with a PMH of arthritis, breast cancer, depression, diabetes, anterior cervical discectomy, COPD and cervical neuropathy who presents today for an office follow up.  1.) Cervical neuropathy -  Recently treated with a prednisone taper and home exercise therapy. Reports improvement of her peripheral pain with the oral prednisone and denies adverse side effects. She does perform the exercises periodically but inconsistently.   2.) Insomnia - Previously diagnosed with insomnia. Averages about 2-3 hours of sleep per night. Does not nap during the day. Describes trouble falling asleep and staying asleep. When she attempts to lay down she cannot wind down. Has tried multiple medications including trazodone which did help with her sleep when she took half a tablet per night. Does not like to take pills. Has failed with Ambien.   3.) Mood changes - Associated symptoms of ease of frustration has been going on for several months. Reports that her husband has some short term memory loss and decreased hearing which provides a source of her frustration. Notes that she handles most of the household responsibilities and has become tired of it. Recently moved in February and notes restlessness from that and that the house is smaller.    Allergies  Allergen Reactions  . Abilify [Aripiprazole]   . Ambien [Zolpidem Tartrate]     Had really bad nightmares  . Gabapentin     In a fog and could not function    Current Outpatient Prescriptions on File Prior to Visit  Medication Sig Dispense Refill  . albuterol (PROVENTIL HFA) 108 (90 BASE) MCG/ACT inhaler Inhale 2 puffs into the lungs  every 6 (six) hours as needed for wheezing or shortness of breath.    . budesonide-formoterol (SYMBICORT) 80-4.5 MCG/ACT inhaler Inhale 2 puffs into the lungs 2 (two) times daily.    . Cholecalciferol (VITAMIN D) 2000 UNITS tablet Take 2,000 Units by mouth daily.    . citalopram (CELEXA) 40 MG tablet Take 40 mg by mouth daily.    . furosemide (LASIX) 40 MG tablet Take 40 mg by mouth.    . losartan (COZAAR) 25 MG tablet Take 25 mg by mouth daily.    Marland Kitchen lovastatin (MEVACOR) 20 MG tablet Take 20 mg by mouth at bedtime.    . Magnesium 400 MG CAPS Take 400 mg by mouth.    . metFORMIN (GLUCOPHAGE) 500 MG tablet Take 500 mg by mouth 2 (two) times daily with a meal.    . potassium chloride (K-DUR) 10 MEQ tablet Take 10 mEq by mouth daily.    Marland Kitchen tiotropium (SPIRIVA) 18 MCG inhalation capsule Place 18 mcg into inhaler and inhale daily.     No current facility-administered medications on file prior to visit.    Past Medical History  Diagnosis Date  . Diabetes mellitus without complication   . Arthritis   . Cancer     Breast cancer  . Depression   . COPD (chronic obstructive pulmonary disease)   . Colon polyp      Review of Systems  Constitutional: Negative for fever and chills.  Musculoskeletal: Positive for neck pain.  Psychiatric/Behavioral: Positive for sleep disturbance.      Objective:    BP 120/70 mmHg  Pulse 80  Temp(Src) 98.3 F (36.8 C) (Oral)  Resp 18  Ht 5\' 6"  (1.676 m)  Wt 155 lb (70.308 kg)  BMI 25.03 kg/m2  SpO2 96% Nursing note and vital signs reviewed.  Physical Exam  Constitutional: She is oriented to person, place, and time. She appears well-developed and well-nourished. No distress.  Cardiovascular: Normal rate, regular rhythm, normal heart sounds and intact distal pulses.   Pulmonary/Chest: Effort normal and breath sounds normal.  Neurological: She is alert and oriented to person, place, and time.  Skin: Skin is warm and dry.  Psychiatric: Her behavior is  normal. Judgment and thought content normal. Her mood appears anxious.       Assessment & Plan:   Problem List Items Addressed This Visit      Nervous and Auditory   Neuropathy, cervical    Continues to experience neck pain, however the radiculopathy has improved with the prednisone taper and exercises. Continue current home exercise regimen and follow-up if symptoms worsen.      Relevant Medications   clonazePAM (KLONOPIN) 0.5 MG tablet     Other   Sleep disturbance - Primary    Continues to experience insomnia sleeping approximately 2 hours per evening. Previously failed attempts with Ambien and had some success with trazodone. Not currently taking any medications. Would like to hold off on medications at this time. Discussed importance of adequate sleep and appropriate sleep hygiene. Follow up if symptoms worsen.      Anxiety state    Symptoms of agitation and short temper consistent with potential anxiety related to familial and monetary stresses. 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 Start Klonopin as needed. Continue current dosage of citalopram. Follow-up if symptoms worsen or do not improve.      Relevant Medications   clonazePAM (KLONOPIN) 0.5 MG tablet    Other Visit Diagnoses    Encounter for immunization

## 2015-02-07 NOTE — Assessment & Plan Note (Signed)
Symptoms of agitation and short temper consistent with potential anxiety related to familial and monetary stresses. 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 Start Klonopin as needed. Continue current dosage of citalopram. Follow-up if symptoms worsen or do not improve.

## 2015-02-07 NOTE — Patient Instructions (Addendum)
Thank you for choosing [facility].  Summary/Instructions:  Please continue to take your medication as prescribed.  Your prescription(s) have been submitted to your pharmacy or been printed and provided for you. Please take as directed and contact our office if you believe you are having problem(s) with the medication(s) or have any questions.  If your symptoms worsen or fail to improve, please contact our office for further instruction, or in case of emergency go directly to the emergency room at the closest medical facility.   Recommendations for improving sleep:   Avoid having pets sleep in the bedroom  Avoid caffeine consumption after 4pm  Keep bedroom cool and conducive to sleep  Avoid nicotine use, especially in the evening  Avoid exercise within 2-3 hours before bedtime  Stimulus Control:   Go to bed only when sleepy  Use the bedroom for sleep and sex only  Go to another room if you are unable to fall asleep within 15 to 20 minutes  Read or engage in other quiet activities and return to bed only when sleepy.  Clonazepam tablets What is this medicine? CLONAZEPAM (kloe NA ze pam) is a benzodiazepine. It is used to treat certain types of seizures. It is also used to treat panic disorder. This medicine may be used for other purposes; ask your health care provider or pharmacist if you have questions. COMMON BRAND NAME(S): Ceberclon, Klonopin What should I tell my health care provider before I take this medicine? They need to know if you have any of these conditions: -an alcohol or drug abuse problem -bipolar disorder, depression, psychosis or other mental health condition -glaucoma -kidney or liver disease -lung or breathing disease -myasthenia gravis -Parkinson's disease -seizures or a history of seizures -suicidal thoughts -an unusual or allergic reaction to clonazepam, other benzodiazepines, foods, dyes, or preservatives -pregnant or trying to get  pregnant -breast-feeding How should I use this medicine? Take this medicine by mouth with a glass of water. Follow the directions on the prescription label. If it upsets your stomach, take it with food or milk. Take your medicine at regular intervals. Do not take it more often than directed. Do not stop taking or change the dose except on the advice of your doctor or health care professional. A special MedGuide will be given to you by the pharmacist with each prescription and refill. Be sure to read this information carefully each time. Talk to your pediatrician regarding the use of this medicine in children. Special care may be needed. Overdosage: If you think you have taken too much of this medicine contact a poison control center or emergency room at once. NOTE: This medicine is only for you. Do not share this medicine with others. What if I miss a dose? If you miss a dose, take it as soon as you can. If it is almost time for your next dose, take only that dose. Do not take double or extra doses. What may interact with this medicine? -herbal or dietary supplements -medicines for depression, anxiety, or psychotic disturbances -medicines for fungal infections like fluconazole, itraconazole, ketoconazole, voriconazole -medicines for HIV infection or AIDS -medicines for sleep -prescription pain medicines -propantheline -rifampin -sevelamer -some medicines for seizures like carbamazepine, phenobarbital, phenytoin, primidone This list may not describe all possible interactions. Give your health care provider a list of all the medicines, herbs, non-prescription drugs, or dietary supplements you use. Also tell them if you smoke, drink alcohol, or use illegal drugs. Some items may interact with your medicine.  What should I watch for while using this medicine? Visit your doctor or health care professional for regular checks on your progress. Your body may become dependent on this medicine. If you  have been taking this medicine regularly for some time, do not suddenly stop taking it. You must gradually reduce the dose or you may get severe side effects. Ask your doctor or health care professional for advice before increasing or decreasing the dose. Even after you stop taking this medicine it can still affect your body for several days. If you suffer from several types of seizures, this medicine may increase the chance of grand mal seizures (epilepsy). Let your doctor or health care professional know, he or she may want to prescribe an additional medicine. You may get drowsy or dizzy. Do not drive, use machinery, or do anything that needs mental alertness until you know how this medicine affects you. To reduce the risk of dizzy and fainting spells, do not stand or sit up quickly, especially if you are an older patient. Alcohol may increase dizziness and drowsiness. Avoid alcoholic drinks. Do not treat yourself for coughs, colds or allergies without asking your doctor or health care professional for advice. Some ingredients can increase possible side effects. The use of this medicine may increase the chance of suicidal thoughts or actions. Pay special attention to how you are responding while on this medicine. Any worsening of mood, or thoughts of suicide or dying should be reported to your health care professional right away. Women who become pregnant while using this medicine may enroll in the [place] Pregnancy Registry by calling [phone]. This registry collects information about the safety of antiepileptic drug use during pregnancy. What side effects may I notice from receiving this medicine? Side effects that you should report to your doctor or health care professional as soon as possible: -allergic reactions like skin rash, itching or hives, swelling of the face, lips, or tongue -changes in vision -confusion -depression -hallucinations -mood changes,  excitability or aggressive behavior -movement difficulty, staggering or jerky movements -muscle cramps, weakness -tremors -unusual eye movements Side effects that usually do not require medical attention (report to your doctor or health care professional if they continue or are bothersome): -constipation or diarrhea -difficulty sleeping, nightmares -dizziness, drowsiness -headache -increased saliva from your mouth -nausea, vomiting This list may not describe all possible side effects. Call your doctor for medical advice about side effects. You may report side effects to FDA at 1-800-FDA-1088. Where should I keep my medicine? Keep out of the reach of children. This medicine can be abused. Keep your medicine in a safe place to protect it from theft. Do not share this medicine with anyone. Selling or giving away this medicine is dangerous and against the law. Store at room temperature between 15 and 30 degrees C (59 and 86 degrees F). Protect from light. Keep container tightly closed. Throw away any unused medicine after the expiration date. NOTE: This sheet is a summary. It may not cover all possible information. If you have questions about this medicine, talk to your doctor, pharmacist, or health care provider.  2015, Elsevier/Gold Standard. (2009-04-14 19:16:36)

## 2015-02-15 ENCOUNTER — Telehealth: Payer: Self-pay

## 2015-02-15 ENCOUNTER — Other Ambulatory Visit: Payer: Self-pay

## 2015-02-15 DIAGNOSIS — E785 Hyperlipidemia, unspecified: Secondary | ICD-10-CM

## 2015-02-15 MED ORDER — LOVASTATIN 20 MG PO TABS: 20.0000 mg | ORAL_TABLET | Freq: Every day | ORAL | Status: DC

## 2015-02-15 NOTE — Telephone Encounter (Signed)
Called pt to let her know that an order for lipid profile will be put in for the next refill of lovastatin.

## 2015-02-21 ENCOUNTER — Encounter: Payer: Self-pay | Admitting: Family

## 2015-02-21 ENCOUNTER — Other Ambulatory Visit (INDEPENDENT_AMBULATORY_CARE_PROVIDER_SITE_OTHER): Payer: PPO

## 2015-02-21 DIAGNOSIS — E785 Hyperlipidemia, unspecified: Secondary | ICD-10-CM | POA: Diagnosis not present

## 2015-02-21 LAB — LIPID PANEL
Cholesterol: 164 mg/dL (ref 0–200)
HDL: 50.2 mg/dL (ref >39.00)
LDL CALC: 91 mg/dL (ref 0–99)
NonHDL: 113.33
TRIGLYCERIDES: 111 mg/dL (ref 0.0–149.0)
Total CHOL/HDL Ratio: 3
VLDL: 22.2 mg/dL (ref 0.0–40.0)

## 2015-03-06 ENCOUNTER — Telehealth: Payer: Self-pay | Admitting: *Deleted

## 2015-03-06 MED ORDER — CITALOPRAM HYDROBROMIDE 40 MG PO TABS: 40.0000 mg | ORAL_TABLET | Freq: Every day | ORAL | Status: DC

## 2015-03-06 NOTE — Telephone Encounter (Signed)
Receive msg pt requesting refill on her citalopram. Inform pt will send to [facility].Marland KitchenJohny Chess

## 2015-05-09 ENCOUNTER — Ambulatory Visit (INDEPENDENT_AMBULATORY_CARE_PROVIDER_SITE_OTHER): Payer: PPO | Admitting: Family

## 2015-05-09 ENCOUNTER — Encounter: Payer: Self-pay | Admitting: Family

## 2015-05-09 VITALS — BP 140/80 | HR 69 | Temp 98.0°F | Resp 18 | Ht 66.0 in | Wt 158.8 lb

## 2015-05-09 DIAGNOSIS — F411 Generalized anxiety disorder: Secondary | ICD-10-CM | POA: Diagnosis not present

## 2015-05-09 DIAGNOSIS — G479 Sleep disorder, unspecified: Secondary | ICD-10-CM | POA: Diagnosis not present

## 2015-05-09 MED ORDER — POTASSIUM CHLORIDE ER 10 MEQ PO TBCR: 10.0000 meq | EXTENDED_RELEASE_TABLET | Freq: Every day | ORAL | Status: DC

## 2015-05-09 MED ORDER — TIOTROPIUM BROMIDE MONOHYDRATE 18 MCG IN CAPS: 18.0000 ug | ORAL_CAPSULE | Freq: Every day | RESPIRATORY_TRACT | Status: DC

## 2015-05-09 MED ORDER — CLONAZEPAM 0.5 MG PO TABS: 0.2500 mg | ORAL_TABLET | Freq: Two times a day (BID) | ORAL | Status: DC | PRN

## 2015-05-09 MED ORDER — FUROSEMIDE 40 MG PO TABS: 40.0000 mg | ORAL_TABLET | Freq: Every day | ORAL | Status: AC | PRN

## 2015-05-09 NOTE — Progress Notes (Signed)
Subjective:    Patient ID: Lorraine Miller, female    DOB: 1945-12-02, 69 y.o.   MRN: [id number]  Chief Complaint  Patient presents with  . Follow-up    klonopin is helping with her anxiety and it helps with her sleep, would like refills    HPI:  Lorraine Miller is a 69 y.o. female who  has a past medical history of Diabetes mellitus without complication ([place]); Arthritis; Cancer ([place]); Depression; COPD (chronic obstructive pulmonary disease) ([place]); and Colon polyp. and presents today for an office follow up.   1.) Anxiety - Currently maintained on citalopram and clonazepam. Takes the medication as prescribed and denies adverse side effects. Reports that the clonazepam has helped with her anxiety and also with her sleep. She takes about 1/2 tablet close to bed and notes that it helps her to sleep. Averages about 6-7 hours worth of sleep with the medication.   Allergies  Allergen Reactions  . Abilify [Aripiprazole]   . Ambien [Zolpidem Tartrate]     Had really bad nightmares  . Gabapentin     In a fog and could not function     Current Outpatient Prescriptions on File Prior to Visit  Medication Sig Dispense Refill  . albuterol (PROVENTIL HFA) 108 (90 BASE) MCG/ACT inhaler Inhale 2 puffs into the lungs every 6 (six) hours as needed for wheezing or shortness of breath.    . budesonide-formoterol (SYMBICORT) 80-4.5 MCG/ACT inhaler Inhale 2 puffs into the lungs 2 (two) times daily.    . Cholecalciferol (VITAMIN D) 2000 UNITS tablet Take 2,000 Units by mouth daily.    . citalopram (CELEXA) 40 MG tablet Take 1 tablet (40 mg total) by mouth daily. 90 tablet 0  . losartan (COZAAR) 25 MG tablet Take 25 mg by mouth daily.    Marland Kitchen lovastatin (MEVACOR) 20 MG tablet Take 1 tablet (20 mg total) by mouth at bedtime. 30 tablet 0  . Magnesium 400 MG CAPS Take 400 mg by mouth.    . metFORMIN (GLUCOPHAGE) 500 MG tablet Take 500 mg by mouth 2 (two) times daily with a meal.     No current  facility-administered medications on file prior to visit.    Review of Systems  Constitutional: Negative for fever and chills.  Psychiatric/Behavioral: Negative for sleep disturbance and dysphoric mood. The patient is not nervous/anxious.       Objective:    BP 140/80 mmHg  Pulse 69  Temp(Src) 98 F (36.7 C) (Oral)  Resp 18  Ht 5\' 6"  (1.676 m)  Wt 158 lb 12.8 oz (72.031 kg)  BMI 25.64 kg/m2  SpO2 96% Nursing note and vital signs reviewed.  Physical Exam  Constitutional: She is oriented to person, place, and time. She appears well-developed and well-nourished. No distress.  Cardiovascular: Normal rate, regular rhythm, normal heart sounds and intact distal pulses.   Pulmonary/Chest: Effort normal and breath sounds normal.  Neurological: She is alert and oriented to person, place, and time.  Skin: Skin is warm and dry.  Psychiatric: She has a normal mood and affect. Her behavior is normal. Judgment and thought content normal.       Assessment & Plan:   Problem List Items Addressed This Visit      Other   Sleep disturbance    Sleep is improved with current dosage of clonazepam. Reports sleep 6-7 hours of sleep on average. Denies adverse side effects. Continue current dosage of clonazepam.       Anxiety state -  Primary    Anxiety is improved with addition of clonazepam. Denies adverse side effects. Continue current dosage of citalopram and clonazepam. Does continue to have significant family and medical stressors. Follow up in 3 months or sooner if needed.       Relevant Medications   clonazePAM (KLONOPIN) 0.5 MG tablet

## 2015-05-09 NOTE — Assessment & Plan Note (Signed)
Sleep is improved with current dosage of clonazepam. Reports sleep 6-7 hours of sleep on average. Denies adverse side effects. Continue current dosage of clonazepam.

## 2015-05-09 NOTE — Patient Instructions (Signed)
Thank you for choosing [facility].  Summary/Instructions:  Your prescription(s) have been submitted to your pharmacy or been printed and provided for you. Please take as directed and contact our office if you believe you are having problem(s) with the medication(s) or have any questions.  If your symptoms worsen or fail to improve, please contact our office for further instruction, or in case of emergency go directly to the emergency room at the closest medical facility.   Please continue to take your medications as prescribed.  

## 2015-05-09 NOTE — Assessment & Plan Note (Signed)
Anxiety is improved with addition of clonazepam. Denies adverse side effects. Continue current dosage of citalopram and clonazepam. Does continue to have significant family and medical stressors. Follow up in 3 months or sooner if needed.

## 2015-05-09 NOTE — Progress Notes (Signed)
Pre visit review using our clinic review tool, if applicable. No additional management support is needed unless otherwise documented below in the visit note. 

## 2015-06-01 ENCOUNTER — Other Ambulatory Visit: Payer: Self-pay | Admitting: Family

## 2015-06-07 ENCOUNTER — Ambulatory Visit (INDEPENDENT_AMBULATORY_CARE_PROVIDER_SITE_OTHER): Payer: PPO | Admitting: Family

## 2015-06-07 ENCOUNTER — Encounter: Payer: Self-pay | Admitting: Family

## 2015-06-07 VITALS — BP 122/82 | HR 78 | Temp 98.6°F | Resp 20 | Ht 66.0 in | Wt 153.6 lb

## 2015-06-07 DIAGNOSIS — J0100 Acute maxillary sinusitis, unspecified: Secondary | ICD-10-CM

## 2015-06-07 DIAGNOSIS — J019 Acute sinusitis, unspecified: Secondary | ICD-10-CM | POA: Insufficient documentation

## 2015-06-07 MED ORDER — AMOXICILLIN-POT CLAVULANATE 875-125 MG PO TABS: 1.0000 | ORAL_TABLET | Freq: Two times a day (BID) | ORAL | Status: DC

## 2015-06-07 NOTE — Assessment & Plan Note (Signed)
Symptoms and exam consistent with acute sinusitis. Start Augmentin. Continue over-the-counter medications as needed for symptom relief and supportive care. Follow-up if symptoms worsen or fail to improve. 

## 2015-06-07 NOTE — Progress Notes (Signed)
Subjective:    Patient ID: Lorraine Miller, female    DOB: Jan 30, 1946, 70 y.o.   MRN: [id number]  Chief Complaint  Patient presents with  . Sinusitis    right side  . Ear Pain    right side  . Sore Throat    drainage    HPI:  Lorraine Miller is a 70 y.o. female who  has a past medical history of Diabetes mellitus without complication ([place]); Arthritis; Cancer ([place]); Depression; COPD (chronic obstructive pulmonary disease) ([place]); and Colon polyp. and presents today for an acute office visit.  This is a new problem. Associated symptoms of right sided ear pain, sinus pressure, sore throat, congestion and occasional cough that has been going on for about 4-5 days. Modifying factors include Sudafed, Alkaseltzer and nasal spray which helped a little. Denies fevers. Denies recent antibiotics.   Allergies  Allergen Reactions  . Abilify [Aripiprazole]   . Ambien [Zolpidem Tartrate]     Had really bad nightmares  . Gabapentin     In a fog and could not function     Current Outpatient Prescriptions on File Prior to Visit  Medication Sig Dispense Refill  . albuterol (PROVENTIL HFA) 108 (90 BASE) MCG/ACT inhaler Inhale 2 puffs into the lungs every 6 (six) hours as needed for wheezing or shortness of breath.    . budesonide-formoterol (SYMBICORT) 80-4.5 MCG/ACT inhaler Inhale 2 puffs into the lungs 2 (two) times daily.    . Cholecalciferol (VITAMIN D) 2000 UNITS tablet Take 2,000 Units by mouth daily.    . citalopram (CELEXA) 40 MG tablet TAKE 1 TABLET (40 MG TOTAL) BY MOUTH DAILY. 90 tablet 0  . clonazePAM (KLONOPIN) 0.5 MG tablet Take 0.5-1 tablets (0.25-0.5 mg total) by mouth 2 (two) times daily as needed for anxiety. 60 tablet 0  . furosemide (LASIX) 40 MG tablet Take 1 tablet (40 mg total) by mouth daily as needed for edema. 30 tablet 2  . losartan (COZAAR) 25 MG tablet Take 25 mg by mouth daily.    Marland Kitchen lovastatin (MEVACOR) 20 MG tablet Take 1 tablet (20 mg total) by mouth at bedtime. 30  tablet 0  . Magnesium 400 MG CAPS Take 400 mg by mouth.    . metFORMIN (GLUCOPHAGE) 500 MG tablet Take 500 mg by mouth 2 (two) times daily with a meal.    . potassium chloride (K-DUR) 10 MEQ tablet Take 1 tablet (10 mEq total) by mouth daily. 30 tablet 2  . tiotropium (SPIRIVA) 18 MCG inhalation capsule Place 1 capsule (18 mcg total) into inhaler and inhale daily. 30 capsule 3   No current facility-administered medications on file prior to visit.    Review of Systems  Constitutional: Negative for fever and chills.  HENT: Positive for congestion, sinus pressure and sore throat.   Respiratory: Positive for cough. Negative for chest tightness and shortness of breath.       Objective:    BP 122/82 mmHg  Pulse 78  Temp(Src) 98.6 F (37 C) (Oral)  Resp 20  Ht 5\' 6"  (1.676 m)  Wt 153 lb 9 oz (69.655 kg)  BMI 24.80 kg/m2  SpO2 96% Nursing note and vital signs reviewed.  Physical Exam  Constitutional: She is oriented to person, place, and time. She appears well-developed and well-nourished.  HENT:  Right Ear: Hearing, tympanic membrane, external ear and ear canal normal.  Left Ear: Hearing, tympanic membrane, external ear and ear canal normal.  Nose: Right sinus exhibits maxillary sinus  tenderness and frontal sinus tenderness. Left sinus exhibits no maxillary sinus tenderness and no frontal sinus tenderness.  Mouth/Throat: Uvula is midline, oropharynx is clear and moist and mucous membranes are normal.  Neck: Neck supple.  Cardiovascular: Normal rate, regular rhythm, normal heart sounds and intact distal pulses.   Pulmonary/Chest: Effort normal and breath sounds normal.  Neurological: She is alert and oriented to person, place, and time.  Skin: Skin is warm and dry.       Assessment & Plan:   Problem List Items Addressed This Visit      Respiratory   Sinusitis, acute - Primary    Symptoms and exam consistent with acute sinusitis. Start Augmentin. Continue over-the-counter  medications as needed for symptom relief and supportive care. Follow-up if symptoms worsen or fail to improve.      Relevant Medications   amoxicillin-clavulanate (AUGMENTIN) 875-125 MG tablet

## 2015-06-07 NOTE — Patient Instructions (Signed)
Thank you for choosing [facility].  Summary/Instructions:  Your prescription(s) have been submitted to your pharmacy or been printed and provided for you. Please take as directed and contact our office if you believe you are having problem(s) with the medication(s) or have any questions.  If your symptoms worsen or fail to improve, please contact our office for further instruction, or in case of emergency go directly to the emergency room at the closest medical facility.   General Recommendations:    Please drink plenty of fluids.  Get plenty of rest   Sleep in humidified air  Use saline nasal sprays  Netti pot   OTC Medications:  Decongestants - helps relieve congestion   Flonase (generic fluticasone) or Nasacort (generic triamcinolone) - please make sure to use the "cross-over" technique at a 45 degree angle towards the opposite eye as opposed to straight up the nasal passageway.   Sudafed (generic pseudoephedrine - Note this is the one that is available behind the pharmacy counter); Products with phenylephrine (-PE) may also be used but is often not as effective as pseudoephedrine.   If you have HIGH BLOOD PRESSURE - Coricidin HBP; AVOID any product that is -D as this contains pseudoephedrine which may increase your blood pressure.  Afrin (oxymetazoline) every 6-8 hours for up to 3 days.   Allergies - helps relieve runny nose, itchy eyes and sneezing   Claritin (generic loratidine), Allegra (fexofenidine), or Zyrtec (generic cyrterizine) for runny nose. These medications should not cause drowsiness.  Note - Benadryl (generic diphenhydramine) may be used however may cause drowsiness  Cough -   Delsym or Robitussin (generic dextromethorphan)  Expectorants - helps loosen mucus to ease removal   Mucinex (generic guaifenesin) as directed on the package.  Headaches / General Aches   Tylenol (generic acetaminophen) - DO NOT EXCEED 3 grams (3,000 mg) in a 24  hour time period  Advil/Motrin (generic ibuprofen)   Sore Throat -   Salt water gargle   Chloraseptic (generic benzocaine) spray or lozenges / Sucrets (generic dyclonine)    Sinusitis Sinusitis is redness, soreness, and inflammation of the paranasal sinuses. Paranasal sinuses are air pockets within the bones of your face (beneath the eyes, the middle of the forehead, or above the eyes). In healthy paranasal sinuses, mucus is able to drain out, and air is able to circulate through them by way of your nose. However, when your paranasal sinuses are inflamed, mucus and air can become trapped. This can allow bacteria and other germs to grow and cause infection. Sinusitis can develop quickly and last only a short time (acute) or continue over a long period (chronic). Sinusitis that lasts for more than 12 weeks is considered chronic.  CAUSES  Causes of sinusitis include:  Allergies.  Structural abnormalities, such as displacement of the cartilage that separates your nostrils (deviated septum), which can decrease the air flow through your nose and sinuses and affect sinus drainage.  Functional abnormalities, such as when the small hairs (cilia) that line your sinuses and help remove mucus do not work properly or are not present. SIGNS AND SYMPTOMS  Symptoms of acute and chronic sinusitis are the same. The primary symptoms are pain and pressure around the affected sinuses. Other symptoms include:  Upper toothache.  Earache.  Headache.  Bad breath.  Decreased sense of smell and taste.  A cough, which worsens when you are lying flat.  Fatigue.  Fever.  Thick drainage from your nose, which often is green and may   contain pus (purulent).  Swelling and warmth over the affected sinuses. DIAGNOSIS  Your health care provider will perform a physical exam. During the exam, your health care provider may:  Look in your nose for signs of abnormal growths in your nostrils (nasal  polyps).  Tap over the affected sinus to check for signs of infection.  View the inside of your sinuses (endoscopy) using an imaging device that has a light attached (endoscope). If your health care provider suspects that you have chronic sinusitis, one or more of the following tests may be recommended:  Allergy tests.  Nasal culture. A sample of mucus is taken from your nose, sent to a lab, and screened for bacteria.  Nasal cytology. A sample of mucus is taken from your nose and examined by your health care provider to determine if your sinusitis is related to an allergy. TREATMENT  Most cases of acute sinusitis are related to a viral infection and will resolve on their own within 10 days. Sometimes medicines are prescribed to help relieve symptoms (pain medicine, decongestants, nasal steroid sprays, or saline sprays).  However, for sinusitis related to a bacterial infection, your health care provider will prescribe antibiotic medicines. These are medicines that will help kill the bacteria causing the infection.  Rarely, sinusitis is caused by a fungal infection. In theses cases, your health care provider will prescribe antifungal medicine. For some cases of chronic sinusitis, surgery is needed. Generally, these are cases in which sinusitis recurs more than 3 times per year, despite other treatments. HOME CARE INSTRUCTIONS   Drink plenty of water. Water helps thin the mucus so your sinuses can drain more easily.  Use a humidifier.  Inhale steam 3 to 4 times a day (for example, sit in the bathroom with the shower running).  Apply a warm, moist washcloth to your face 3 to 4 times a day, or as directed by your health care provider.  Use saline nasal sprays to help moisten and clean your sinuses.  Take medicines only as directed by your health care provider.  If you were prescribed either an antibiotic or antifungal medicine, finish it all even if you start to feel better. SEEK IMMEDIATE  MEDICAL CARE IF:  You have increasing pain or severe headaches.  You have nausea, vomiting, or drowsiness.  You have swelling around your face.  You have vision problems.  You have a stiff neck.  You have difficulty breathing. MAKE SURE YOU:   Understand these instructions.  Will watch your condition.  Will get help right away if you are not doing well or get worse. Document Released: 05/20/2005 Document Revised: 10/04/2013 Document Reviewed: 06/04/2011 ExitCare Patient Information 2015 [employer]. This information is not intended to replace advice given to you by your health care provider. Make sure you discuss any questions you have with your health care provider.   

## 2015-06-07 NOTE — Progress Notes (Signed)
Pre visit review using our clinic review tool, if applicable. No additional management support is needed unless otherwise documented below in the visit note. 

## 2015-06-12 ENCOUNTER — Telehealth: Payer: Self-pay | Admitting: Family

## 2015-06-12 MED ORDER — LOSARTAN POTASSIUM 25 MG PO TABS: 25.0000 mg | ORAL_TABLET | Freq: Every day | ORAL | Status: DC

## 2015-06-12 NOTE — Telephone Encounter (Signed)
Pt requesting refill for losartan (COZAAR) 25 MG tablet [id number] [facility] is [facility] [address].

## 2015-06-12 NOTE — Telephone Encounter (Signed)
Rx sent. Pt aware.  

## 2015-06-30 ENCOUNTER — Telehealth: Payer: Self-pay | Admitting: *Deleted

## 2015-06-30 MED ORDER — BUDESONIDE-FORMOTEROL FUMARATE 80-4.5 MCG/ACT IN AERO: 2.0000 | INHALATION_SPRAY | Freq: Two times a day (BID) | RESPIRATORY_TRACT | Status: DC

## 2015-06-30 NOTE — Telephone Encounter (Signed)
Receive call pt is needing refills on her symbicort. Verified pharmacy inform sending electronically to [facility]...Johny Chess

## 2015-07-31 ENCOUNTER — Telehealth: Payer: Self-pay | Admitting: Family

## 2015-07-31 DIAGNOSIS — R739 Hyperglycemia, unspecified: Secondary | ICD-10-CM

## 2015-07-31 NOTE — Telephone Encounter (Signed)
Called pt to let her know she needed to come in for updated A1c. Pt is aware. Medication will be sent in based on lab results.

## 2015-07-31 NOTE — Telephone Encounter (Signed)
Pt called in and said that needs a new script for her metFORMIN (GLUCOPHAGE) 500 MG tablet [id number] called in   [place] at [address]

## 2015-08-01 ENCOUNTER — Other Ambulatory Visit (INDEPENDENT_AMBULATORY_CARE_PROVIDER_SITE_OTHER): Payer: PPO

## 2015-08-01 ENCOUNTER — Encounter: Payer: Self-pay | Admitting: Family

## 2015-08-01 DIAGNOSIS — R739 Hyperglycemia, unspecified: Secondary | ICD-10-CM

## 2015-08-01 LAB — HEMOGLOBIN A1C: Hgb A1c MFr Bld: 6.3 % (ref 4.6–6.5)

## 2015-08-02 MED ORDER — METFORMIN HCL 500 MG PO TABS: 500.0000 mg | ORAL_TABLET | Freq: Two times a day (BID) | ORAL | Status: DC

## 2015-08-02 NOTE — Addendum Note (Signed)
Addended by: Delice Bison E on: 08/02/2015 04:48 PM   Modules accepted: Orders, Medications

## 2015-08-02 NOTE — Telephone Encounter (Signed)
Rx sent. Pt is aware.  °

## 2015-08-02 NOTE — Telephone Encounter (Signed)
Greg sent an email last night for her to continue the metformin, but none has been sent in.  Can you please send this in today to the [facility] on [address]

## 2015-08-25 DIAGNOSIS — E1122 Type 2 diabetes mellitus with diabetic chronic kidney disease: Secondary | ICD-10-CM | POA: Diagnosis not present

## 2015-08-25 DIAGNOSIS — J449 Chronic obstructive pulmonary disease, unspecified: Secondary | ICD-10-CM | POA: Diagnosis not present

## 2015-08-25 DIAGNOSIS — I129 Hypertensive chronic kidney disease with stage 1 through stage 4 chronic kidney disease, or unspecified chronic kidney disease: Secondary | ICD-10-CM | POA: Diagnosis not present

## 2015-08-25 DIAGNOSIS — I509 Heart failure, unspecified: Secondary | ICD-10-CM | POA: Diagnosis not present

## 2016-02-19 DIAGNOSIS — I129 Hypertensive chronic kidney disease with stage 1 through stage 4 chronic kidney disease, or unspecified chronic kidney disease: Secondary | ICD-10-CM | POA: Diagnosis not present

## 2016-02-19 DIAGNOSIS — Z0001 Encounter for general adult medical examination with abnormal findings: Secondary | ICD-10-CM | POA: Diagnosis not present

## 2016-02-19 DIAGNOSIS — E785 Hyperlipidemia, unspecified: Secondary | ICD-10-CM | POA: Diagnosis not present

## 2016-02-19 DIAGNOSIS — Z0000 Encounter for general adult medical examination without abnormal findings: Secondary | ICD-10-CM | POA: Diagnosis not present

## 2016-02-19 DIAGNOSIS — E1122 Type 2 diabetes mellitus with diabetic chronic kidney disease: Secondary | ICD-10-CM | POA: Diagnosis not present

## 2016-03-04 DIAGNOSIS — F339 Major depressive disorder, recurrent, unspecified: Secondary | ICD-10-CM | POA: Diagnosis not present

## 2016-03-04 DIAGNOSIS — J449 Chronic obstructive pulmonary disease, unspecified: Secondary | ICD-10-CM | POA: Diagnosis not present

## 2016-03-04 DIAGNOSIS — Z0000 Encounter for general adult medical examination without abnormal findings: Secondary | ICD-10-CM | POA: Diagnosis not present

## 2016-03-04 DIAGNOSIS — I509 Heart failure, unspecified: Secondary | ICD-10-CM | POA: Diagnosis not present

## 2016-05-01 ENCOUNTER — Emergency Department (HOSPITAL_COMMUNITY): Payer: PPO

## 2016-05-01 ENCOUNTER — Encounter (HOSPITAL_COMMUNITY): Payer: Self-pay | Admitting: Nurse Practitioner

## 2016-05-01 ENCOUNTER — Emergency Department (HOSPITAL_COMMUNITY)
Admission: EM | Admit: 2016-05-01 | Discharge: 2016-05-01 | Disposition: A | Discharge status: Home / Self Care | Payer: PPO | Attending: Emergency Medicine | Admitting: Emergency Medicine

## 2016-05-01 DIAGNOSIS — E119 Type 2 diabetes mellitus without complications: Secondary | ICD-10-CM | POA: Diagnosis not present

## 2016-05-01 DIAGNOSIS — R42 Dizziness and giddiness: Secondary | ICD-10-CM | POA: Diagnosis not present

## 2016-05-01 DIAGNOSIS — Z87891 Personal history of nicotine dependence: Secondary | ICD-10-CM | POA: Insufficient documentation

## 2016-05-01 DIAGNOSIS — Z7984 Long term (current) use of oral hypoglycemic drugs: Secondary | ICD-10-CM | POA: Diagnosis not present

## 2016-05-01 DIAGNOSIS — R55 Syncope and collapse: Secondary | ICD-10-CM | POA: Insufficient documentation

## 2016-05-01 DIAGNOSIS — Z853 Personal history of malignant neoplasm of breast: Secondary | ICD-10-CM | POA: Diagnosis not present

## 2016-05-01 DIAGNOSIS — J449 Chronic obstructive pulmonary disease, unspecified: Secondary | ICD-10-CM | POA: Diagnosis not present

## 2016-05-01 DIAGNOSIS — R404 Transient alteration of awareness: Secondary | ICD-10-CM | POA: Diagnosis not present

## 2016-05-01 DIAGNOSIS — Z79899 Other long term (current) drug therapy: Secondary | ICD-10-CM | POA: Diagnosis not present

## 2016-05-01 DIAGNOSIS — R112 Nausea with vomiting, unspecified: Secondary | ICD-10-CM | POA: Diagnosis not present

## 2016-05-01 LAB — I-STAT TROPONIN, ED
TROPONIN I, POC: 0 ng/mL (ref 0.00–0.08)
Troponin i, poc: 0 ng/mL (ref 0.00–0.08)

## 2016-05-01 LAB — CBC
HCT: 41.5 % (ref 36.0–46.0)
Hemoglobin: 14.1 g/dL (ref 12.0–15.0)
MCH: 29.8 pg (ref 26.0–34.0)
MCHC: 34 g/dL (ref 30.0–36.0)
MCV: 87.7 fL (ref 78.0–100.0)
Platelets: 330 K/uL (ref 150–400)
RBC: 4.73 MIL/uL (ref 3.87–5.11)
RDW: 12.6 % (ref 11.5–15.5)
WBC: 10.5 K/uL (ref 4.0–10.5)

## 2016-05-01 LAB — BASIC METABOLIC PANEL WITH GFR
Anion gap: 9 (ref 5–15)
BUN: 10 mg/dL (ref 6–20)
CO2: 27 mmol/L (ref 22–32)
Calcium: 9.4 mg/dL (ref 8.9–10.3)
Chloride: 105 mmol/L (ref 101–111)
Creatinine, Ser: 0.77 mg/dL (ref 0.44–1.00)
GFR calc Af Amer: >60 mL/min
GFR calc non Af Amer: >60 mL/min
Glucose, Bld: 110 mg/dL — ABNORMAL HIGH (ref 65–99)
Potassium: 3.8 mmol/L (ref 3.5–5.1)
Sodium: 141 mmol/L (ref 135–145)

## 2016-05-01 LAB — URINE MICROSCOPIC-ADD ON: RBC / HPF: NONE SEEN RBC/hpf (ref 0–5)

## 2016-05-01 LAB — URINALYSIS, ROUTINE W REFLEX MICROSCOPIC
Bilirubin Urine: NEGATIVE
GLUCOSE, UA: NEGATIVE mg/dL
HGB URINE DIPSTICK: NEGATIVE
Ketones, ur: NEGATIVE mg/dL
Nitrite: NEGATIVE
PH: 6.5 (ref 5.0–8.0)
Protein, ur: NEGATIVE mg/dL
SPECIFIC GRAVITY, URINE: 1.009 (ref 1.005–1.030)

## 2016-05-01 MED ORDER — SODIUM CHLORIDE 0.9 % IV BOLUS (SEPSIS)
1000.0000 mL | Freq: Once | INTRAVENOUS | Status: AC
  Administered 2016-05-01: 1000 mL via INTRAVENOUS

## 2016-05-01 MED ORDER — ASPIRIN 81 MG PO CHEW
324.0000 mg | CHEWABLE_TABLET | Freq: Once | ORAL | Status: AC
  Administered 2016-05-01: 324 mg via ORAL
  Filled 2016-05-01: qty 4

## 2016-05-01 NOTE — ED Triage Notes (Signed)
Per EMS pt from home c/o sudden onset of dizziness, diaphoresis and near syncopal episode lasting approximately 20 minutes. Pt had nausea and one episode of vomiting. Upon EMS arrival all symptoms had resolved. Pt negative stroke scale and negative orthostatics. Pt in NAD at present time.

## 2016-05-01 NOTE — ED Provider Notes (Signed)
[facility] DEPT Provider Note   CSN: [id number] Arrival date & time: 05/01/16  1523     History   Chief Complaint Chief Complaint  Patient presents with  . Near Syncope    HPI Lorraine Miller is a 70 y.o. female.   Illness  This is a new problem. The current episode started less than 1 hour ago. The problem occurs constantly. Pertinent negatives include no chest pain, no abdominal pain, no headaches and no shortness of breath. Associated symptoms comments: Lightheaded, diaphoresis, n/v. Nothing aggravates the symptoms. Nothing relieves the symptoms. She has tried nothing for the symptoms. The treatment provided no relief.    Past Medical History:  Diagnosis Date  . Arthritis   . Cancer [facility])    Breast cancer  . Colon polyp   . COPD (chronic obstructive pulmonary disease) ([place])   . Depression   . Diabetes mellitus without complication [facility])     Patient Active Problem List   Diagnosis Date Noted  . Sinusitis, acute 06/07/2015  . Sleep disturbance 02/07/2015  . Anxiety state 02/07/2015  . Neuropathy, cervical 01/06/2015  . COPD (chronic obstructive pulmonary disease) ([place]) 01/06/2015    Past Surgical History:  Procedure Laterality Date  . ABDOMINAL HYSTERECTOMY    . ANTERIOR CERVICAL DISCECTOMY    . APPENDECTOMY    . BREAST LUMPECTOMY    . double mastectomy    . PARATHYROIDECTOMY    . POSTERIOR CERVICAL FUSION/FORAMINOTOMY    . surgical breast biopsy    . TONSILLECTOMY      OB History    No data available       Home Medications    Prior to Admission medications   Medication Sig Start Date End Date Taking? Authorizing Provider  albuterol (PROVENTIL HFA) 108 (90 BASE) MCG/ACT inhaler Inhale 2 puffs into the lungs every 6 (six) hours as needed for wheezing or shortness of breath.    Historical Provider, MD  amoxicillin-clavulanate (AUGMENTIN) 875-125 MG tablet Take 1 tablet by mouth 2 (two) times daily. 06/07/15   Golden Circle, FNP    budesonide-formoterol (SYMBICORT) 80-4.5 MCG/ACT inhaler Inhale 2 puffs into the lungs 2 (two) times daily. 06/30/15   Golden Circle, FNP  Cholecalciferol (VITAMIN D) 2000 UNITS tablet Take 2,000 Units by mouth daily.    Historical Provider, MD  citalopram (CELEXA) 40 MG tablet TAKE 1 TABLET (40 MG TOTAL) BY MOUTH DAILY. 06/01/15   Golden Circle, FNP  clonazePAM (KLONOPIN) 0.5 MG tablet Take 0.5-1 tablets (0.25-0.5 mg total) by mouth 2 (two) times daily as needed for anxiety. 05/09/15   Golden Circle, FNP  furosemide (LASIX) 40 MG tablet Take 1 tablet (40 mg total) by mouth daily as needed for edema. 05/09/15   Golden Circle, FNP  losartan (COZAAR) 25 MG tablet Take 1 tablet (25 mg total) by mouth daily. 06/12/15   Golden Circle, FNP  lovastatin (MEVACOR) 20 MG tablet Take 1 tablet (20 mg total) by mouth at bedtime. 02/15/15   Golden Circle, FNP  Magnesium 400 MG CAPS Take 400 mg by mouth.    Historical Provider, MD  metFORMIN (GLUCOPHAGE) 500 MG tablet Take 1 tablet (500 mg total) by mouth 2 (two) times daily with a meal. 08/02/15   Golden Circle, FNP  potassium chloride (K-DUR) 10 MEQ tablet Take 1 tablet (10 mEq total) by mouth daily. 05/09/15   Golden Circle, FNP  tiotropium (SPIRIVA) 18 MCG inhalation capsule Place 1 capsule (18 mcg  total) into inhaler and inhale daily. 05/09/15   Golden Circle, FNP    Family History Family History  Problem Relation Age of Onset  . Multiple myeloma Mother   . Heart disease Father   . Diabetes Father   . Healthy Maternal Grandmother   . Healthy Maternal Grandfather   . Stomach cancer Paternal Grandmother   . Diabetes Paternal Grandfather   . Healthy Paternal Grandfather     Social History Social History  Substance Use Topics  . Smoking status: Former Smoker    Packs/day: 0.25    Years: 28.00    Types: Cigarettes    Quit date: 06/03/2001  . Smokeless tobacco: Never Used  . Alcohol use No     Allergies   Abilify  [aripiprazole]; Ambien [zolpidem tartrate]; and Gabapentin   Review of Systems Review of Systems  Constitutional: Positive for diaphoresis. Negative for chills and fever.  HENT: Negative for ear pain and sore throat.   Eyes: Negative for pain and visual disturbance.  Respiratory: Negative for cough and shortness of breath.   Cardiovascular: Negative for chest pain and palpitations.  Gastrointestinal: Positive for nausea and vomiting. Negative for abdominal pain.  Genitourinary: Negative for dysuria and hematuria.  Musculoskeletal: Negative for arthralgias and back pain.  Skin: Negative for color change and rash.  Neurological: Positive for light-headedness. Negative for seizures, syncope and headaches.  All other systems reviewed and are negative.    Physical Exam Updated Vital Signs BP (!) 115/52   Pulse 69   Temp 98.4 F (36.9 C) (Oral)   Resp 15   Ht '5\' 6"'  (1.676 m)   Wt 69.9 kg   SpO2 97%   BMI 24.86 kg/m   Physical Exam  Constitutional: She is oriented to person, place, and time. She appears well-developed and well-nourished. No distress.  HENT:  Head: Normocephalic and atraumatic.  Eyes: Conjunctivae are normal.  Neck: Neck supple.  Cardiovascular: Normal rate and regular rhythm.  Exam reveals no gallop and no friction rub.   No murmur heard. Pulmonary/Chest: Effort normal and breath sounds normal. No respiratory distress. She has no wheezes. She has no rales.  Abdominal: Soft. There is no tenderness.  Musculoskeletal: She exhibits no edema.  Neurological: She is alert and oriented to person, place, and time. She has normal strength. No cranial nerve deficit or sensory deficit. She exhibits normal muscle tone. She displays a negative Romberg sign. Coordination and gait normal. GCS eye subscore is 4. GCS verbal subscore is 5. GCS motor subscore is 6.  No dysmetria, no dysdiadochokinesia  Skin: Skin is warm and dry.  Psychiatric: She has a normal mood and affect.    Nursing note and vitals reviewed.    ED Treatments / Results  Labs (all labs ordered are listed, but only abnormal results are displayed) Labs Reviewed  BASIC METABOLIC PANEL - Abnormal; Notable for the following:       Result Value   Glucose, Bld 110 (*)    All other components within normal limits  URINALYSIS, ROUTINE W REFLEX MICROSCOPIC (NOT AT [facility]) - Abnormal; Notable for the following:    Leukocytes, UA SMALL (*)    All other components within normal limits  URINE MICROSCOPIC-ADD ON - Abnormal; Notable for the following:    Squamous Epithelial / LPF 0-5 (*)    Bacteria, UA RARE (*)    Casts HYALINE CASTS (*)    All other components within normal limits  CBC  I-STAT TROPOININ, ED  I-STAT  [place], ED    EKG  EKG Interpretation None       Radiology Dg Chest 2 View  Result Date: 05/01/2016 CLINICAL DATA:  Syncope EXAM: CHEST  2 VIEW COMPARISON:  04/10/2010 FINDINGS: COPD with hyperinflation. Negative for heart failure or pneumonia. Mild atelectasis or scarring in the lung bases. Negative for thoracic fracture. IMPRESSION: COPD with mild bibasilar atelectasis/scarring. Electronically Signed   By: Franchot Gallo M.D.   On: 05/01/2016 16:33   Ct Head Wo Contrast  Result Date: 05/01/2016 CLINICAL DATA:  70 y/o  F; sudden onset dizziness with syncope. EXAM: CT HEAD WITHOUT CONTRAST TECHNIQUE: Contiguous axial images were obtained from the base of the skull through the vertex without intravenous contrast. COMPARISON:  None. FINDINGS: Brain: No evidence of acute infarction, hemorrhage, hydrocephalus, extra-axial collection or mass lesion/mass effect. Vascular: No hyperdense vessel. Minimal cavernous carotid calcification. Skull: Normal. Negative for fracture or focal lesion. Sinuses/Orbits: Left frontal sinus mucosal thickening. Small right maxillary sinus mucous retention cyst. Otherwise visualized paranasal sinuses and mastoid air cells are clear. Other: None. IMPRESSION:  No acute intracranial abnormality. Mild left frontal sinus disease. Otherwise unremarkable CT of head for age. Electronically Signed   By: Kristine Garbe M.D.   On: 05/01/2016 18:18    Procedures Procedures (including critical care time)  Medications Ordered in ED Medications  aspirin chewable tablet 324 mg (324 mg Oral Given 05/01/16 1601)  sodium chloride 0.9 % bolus 1,000 mL (0 mLs Intravenous Stopped 05/01/16 1655)     Initial Impression / Assessment and Plan / ED Course  I have reviewed the triage vital signs and the nursing notes.  Pertinent labs & imaging results that were available during my care of the patient were reviewed by me and considered in my medical decision making (see chart for details).  Clinical Course     29-year-old female comes today with a near syncopal episode, she was cutting her Strattera making a cake when she all of a sudden she felt diaphoretic lightheaded nauseous. She took her home glucose and it was 120s she took her blood pressure was low in the 40s or 50s. She was transported here via EMS their glucose was 130. Vital signs were stable through transport, with no hypotension or tachycardia. Here her exam is unremarkable for any neurologic deficit. She claims she is at her baseline. Her vitals are stable she's afebrile. Risk factors for coronary artery disease or heart disease causing the near syncopal episode are low and she is well controlled on medical management. She has had a history of a coronary catheterization that was negative per her report however this was in a state we do not have the official record. Patient's EKG appears to be normal sinus rhythm with no acute signs of ischemia interval abnormality or arrhythmia. We will order chest x-ray patient will be given aspirin CBC BMP, urinalysis, delta troponin. The fall was negatives likely a syncopal episode related to vasovagal tone. Patient has had fainting episodes in the past.  Patient  spell to troponin is negative. EKG is as described above. CT head shows no acute intracranial abnormalities. Urinalysis is unremarkable. Chemistry shows no acute abnormalities that could cause patient's presentation. She is safe for discharge home with outpatient follow-up as needed. She is told to return with any concerns. Strict return precautions provided. Vital signs stable time of discharge.   Final Clinical Impressions(s) / ED Diagnoses   Final diagnoses:  Near syncope    New Prescriptions New Prescriptions  No medications on file     Dewaine Conger, MD 05/01/16 2015    Gwenyth Allegra Tegeler, MD 05/02/16 [phone]

## 2016-05-08 ENCOUNTER — Encounter: Payer: Self-pay | Admitting: Family

## 2016-07-01 DIAGNOSIS — E119 Type 2 diabetes mellitus without complications: Secondary | ICD-10-CM | POA: Diagnosis not present

## 2016-07-02 ENCOUNTER — Ambulatory Visit (INDEPENDENT_AMBULATORY_CARE_PROVIDER_SITE_OTHER): Payer: PPO | Admitting: Cardiovascular Disease

## 2016-07-02 ENCOUNTER — Encounter: Payer: Self-pay | Admitting: Cardiovascular Disease

## 2016-07-02 VITALS — BP 108/66 | HR 77 | Ht 66.0 in | Wt 154.0 lb

## 2016-07-02 DIAGNOSIS — I10 Essential (primary) hypertension: Secondary | ICD-10-CM | POA: Diagnosis not present

## 2016-07-02 DIAGNOSIS — R55 Syncope and collapse: Secondary | ICD-10-CM

## 2016-07-02 DIAGNOSIS — R0989 Other specified symptoms and signs involving the circulatory and respiratory systems: Secondary | ICD-10-CM

## 2016-07-02 DIAGNOSIS — E109 Type 1 diabetes mellitus without complications: Secondary | ICD-10-CM

## 2016-07-02 DIAGNOSIS — Z79899 Other long term (current) drug therapy: Secondary | ICD-10-CM

## 2016-07-02 DIAGNOSIS — E784 Other hyperlipidemia: Secondary | ICD-10-CM | POA: Diagnosis not present

## 2016-07-02 DIAGNOSIS — R42 Dizziness and giddiness: Secondary | ICD-10-CM

## 2016-07-02 DIAGNOSIS — J449 Chronic obstructive pulmonary disease, unspecified: Secondary | ICD-10-CM

## 2016-07-02 DIAGNOSIS — E7849 Other hyperlipidemia: Secondary | ICD-10-CM

## 2016-07-02 NOTE — Progress Notes (Signed)
Cardiology Office Note    Date:  07/03/2016   ID:  Lorraine Miller, DOB 1945-06-28, MRN [id number]  PCP:  Thressa Sheller, MD  Cardiologist:  Shelva Majestic, MD   Chief Complaint  Patient presents with  . Follow-up    New patient.    History of Present Illness:  Lorraine Miller is a 71 y.o. female who presents to establish cardiology care and for further evaluation of several  presyncopal spells.  Lorraine Miller has a history of diabetes mellitus for 14 years, COPD, diabetes mellitus, hyperlipidemia, as well as depression.  Approximately 78 years ago, she had undergone a stress test by Dr. Kerrin Mo at the current [facility] in [place] which was abnormal and ultimately saw Dr. Neldon Newport show and had a heart catheterization.  She was told that her coronaries were normal at that time.  She has a history of breast CA in situ and underwent double mastectomy with subsequent breast reconstructive surgery.  On 05/01/2016 was she was cutting her husband's hair.  She became very lightheaded and presyncopal.  Reportedly her husband had taken her blood pressure which was very low.  By the time EMS arrived, the patient states EMS blood pressure recording was 90/55.  She presented to [facility] hospital and on presentation her blood pressure had risen to 115/52.  CT scan did not reveal any acute abnormality.  She had mild left frontal sinus disease.  A chest x-ray showed COPD with mild bibasal atelectasis glass scarring.  Her ECG was unremarkable.  Troponins were negative.  She again had another short-lived episode of presyncope on New Year's Day, which ultimately resolved.  She is uncertain if she was dehydrated on both of these occasions.  She denied associated chest tightness.  Her primary physician, Dr. Alyson Ingles is on medical leave.  She recently had lab work at [facility].  I reviewed the laboratory which she brought with her today.  When she looked at her medical record online she saw that  that she may have mild renal failure and heart failure.  The patient states she was never told this and presents now for cardiologic evaluation  Past Medical History:  Diagnosis Date  . Arthritis   . Cancer [facility])    Breast cancer  . Colon polyp   . COPD (chronic obstructive pulmonary disease) ([place])   . Depression   . Diabetes mellitus without complication [facility])     Past Surgical History:  Procedure Laterality Date  . ABDOMINAL HYSTERECTOMY    . ANTERIOR CERVICAL DISCECTOMY    . APPENDECTOMY    . BREAST LUMPECTOMY    . double mastectomy    . PARATHYROIDECTOMY    . POSTERIOR CERVICAL FUSION/FORAMINOTOMY    . surgical breast biopsy    . TONSILLECTOMY      Current Medications: Outpatient Medications Prior to Visit  Medication Sig Dispense Refill  . albuterol (PROVENTIL HFA) 108 (90 BASE) MCG/ACT inhaler Inhale 2 puffs into the lungs every 6 (six) hours as needed for wheezing or shortness of breath.    . budesonide-formoterol (SYMBICORT) 80-4.5 MCG/ACT inhaler Inhale 2 puffs into the lungs 2 (two) times daily. 1 Inhaler 5  . Cholecalciferol (VITAMIN D) 2000 UNITS tablet Take 2,000 Units by mouth daily.    . clonazePAM (KLONOPIN) 0.5 MG tablet Take 0.5-1 tablets (0.25-0.5 mg total) by mouth 2 (two) times daily as needed for anxiety. 60 tablet 0  . furosemide (LASIX) 40 MG tablet Take 1 tablet (40 mg total) by  mouth daily as needed for edema. 30 tablet 2  . losartan (COZAAR) 25 MG tablet Take 1 tablet (25 mg total) by mouth daily. 90 tablet 0  . lovastatin (MEVACOR) 20 MG tablet Take 1 tablet (20 mg total) by mouth at bedtime. 30 tablet 0  . Magnesium 400 MG CAPS Take 400 mg by mouth.    . metFORMIN (GLUCOPHAGE) 500 MG tablet Take 1 tablet (500 mg total) by mouth 2 (two) times daily with a meal. 60 tablet 2  . potassium chloride (K-DUR) 10 MEQ tablet Take 1 tablet (10 mEq total) by mouth daily. 30 tablet 2  . tiotropium (SPIRIVA) 18 MCG inhalation capsule Place 1 capsule (18 mcg  total) into inhaler and inhale daily. 30 capsule 3  . amoxicillin-clavulanate (AUGMENTIN) 875-125 MG tablet Take 1 tablet by mouth 2 (two) times daily. 20 tablet 0  . citalopram (CELEXA) 40 MG tablet TAKE 1 TABLET (40 MG TOTAL) BY MOUTH DAILY. 90 tablet 0   No facility-administered medications prior to visit.      Allergies:   Abilify [aripiprazole]; Ambien [zolpidem tartrate]; and Gabapentin   Social History   Social History  . Marital status: Married    Spouse name: N/A  . Number of children: 2  . Years of education: 3   Social History Main Topics  . Smoking status: Former Smoker    Packs/day: 0.25    Years: 28.00    Types: Cigarettes    Quit date: 06/03/2001  . Smokeless tobacco: Never Used  . Alcohol use No  . Drug use: No  . Sexual activity: Not Asked   Other Topics Concern  . None   Social History Narrative   Fun: Scientist, research (life sciences) and crafts   Denies religious beliefs effecting health care.   Denies abuse and feels safe at home.      Family History:  The patient's family history includes Diabetes in her father and paternal grandfather; Healthy in her maternal grandfather, maternal grandmother, and paternal grandfather; Heart disease in her father; Multiple myeloma in her mother; Stomach cancer in her paternal grandmother.   ROS General: Negative; No fevers, chills, or night sweats;  HEENT: Negative; No changes in vision or hearing, sinus congestion, difficulty swallowing Pulmonary: Negative; No cough, wheezing, shortness of breath, hemoptysis Cardiovascular: See history of present illness GI: Negative; No nausea, vomiting, diarrhea, or abdominal pain GU: Negative; No dysuria, hematuria, or difficulty voiding Musculoskeletal: Negative; no myalgias, joint pain, or weakness Hematologic/Oncology: Negative; no easy bruising, bleeding Endocrine: Negative; no heat/cold intolerance; no diabetes Neuro: Negative; no changes in balance, headaches Skin: Negative; No rashes or skin  lesions Psychiatric: Negative; No behavioral problems, depression Sleep: Negative; No snoring, daytime sleepiness, hypersomnolence, bruxism, restless legs, hypnogognic hallucinations, no cataplexy Other comprehensive 14 point system review is negative.   PHYSICAL EXAM:   VS:  BP 108/66   Pulse 77   Ht 5' 6" (1.676 m)   Wt 154 lb (69.9 kg)   BMI 24.86 kg/m    Wt Readings from Last 3 Encounters:  07/02/16 154 lb (69.9 kg)  05/01/16 154 lb (69.9 kg)  06/07/15 153 lb 9 oz (69.7 kg)    General: Alert, oriented, no distress.  Skin: normal turgor, no rashes, warm and dry HEENT: Normocephalic, atraumatic. Pupils equal round and reactive to light; sclera anicteric; extraocular muscles intact; Fundi No hemorrhages or exudates Nose without nasal septal hypertrophy Mouth/Parynx benign; Mallinpatti scale 3 Neck: No JVD, no carotid bruits; normal carotid upstroke Lungs: clear to ausculatation  and percussion; no wheezing or rales Chest wall: without tenderness to palpitation Heart: PMI not displaced, RRR, s1 s2 normal, 1/6 systolic murmur, no diastolic murmur, no rubs, gallops, thrills, or heaves Abdomen: soft, nontender; no hepatosplenomehaly, BS+; abdominal aorta nontender and not dilated by palpation. Back: no CVA tenderness Pulses 2+ Musculoskeletal: full range of motion, normal strength, no joint deformities Extremities: no clubbing cyanosis or edema, Homan's sign negative  Neurologic: grossly nonfocal; Cranial nerves grossly wnl Psychologic: Normal mood and affect   Studies/Labs Reviewed:   EKG:  EKG is ordered today.  ECG (independently read by me): Normal sinus rhythm at 77 bpm.  No significant ST-T changes.  PR interval 114 ms; QRS duration 84 ms; QTc interval normal at 411 milliseconds.  Recent Labs: BMP Latest Ref Rng & Units 05/01/2016 12/29/2013 12/20/2013  Glucose 65 - 99 mg/dL 110(H) 119(H) 97  BUN 6 - 20 mg/dL _0 Creatinine 0.44 - 1.00 mg/dL 0.77 0.80 0.80  Sodium  135 - 145 mmol/L 141 142 140  Potassium 3.5 - 5.1 mmol/L 3.8 3.7 4.2  Chloride 101 - 111 mmol/L 105 105 106  CO2 22 - 32 mmol/L 27 32 28  Calcium 8.9 - 10.3 mg/dL 9.4 8.7 9.9     Hepatic Function Latest Ref Rng & Units 12/29/2013 12/20/2013  Total Protein 6.4 - 8.2 g/dL - 6.9  Albumin 3.4 - 5.0 g/dL 3.5 3.9  AST 15 - 37 Unit/L - 25  ALT 12 - 78 U/L - 38  Alk Phosphatase Unit/L - 88  Total Bilirubin 0.2 - 1.0 mg/dL - 1.0    CBC Latest Ref Rng & Units 05/01/2016 12/20/2013 12/30/2011  WBC 4.0 - 10.5 K/uL 10.5 8.7 11.6(H)  Hemoglobin 12.0 - 15.0 g/dL 14.1 13.9 12.6  Hematocrit 36.0 - 46.0 % 41.5 42.3 37.3  Platelets 150 - 400 K/uL 330 320 387   Lab Results  Component Value Date   MCV 87.7 05/01/2016   MCV 91 12/20/2013   MCV 89 12/30/2011   No results found for: TSH Lab Results  Component Value Date   HGBA1C 6.3 08/01/2015     BNP No results found for: BNP  ProBNP No results found for: PROBNP   Lipid Panel     Component Value Date/Time   CHOL 164 02/21/2015 0731   TRIG 111.0 02/21/2015 0731   HDL 50.20 02/21/2015 0731   CHOLHDL 3 02/21/2015 0731   VLDL 22.2 02/21/2015 0731   LDLCALC 91 02/21/2015 0731     RADIOLOGY: No results found.   Additional studies/ records that were reviewed today include:  I reviewed the emergency room records as well as records from [facility].    ASSESSMENT:    1. Essential hypertension, benign   2. Type 2 diabetes mellitus without complication (HCC)   3. Other hyperlipidemia   4. Bruit   5. Drug therapy   6. Chronic obstructive pulmonary disease, unspecified COPD type ([place])   7. Postural dizziness with presyncope      PLAN:  Lorraine Miller is a female who is the wife of my patient.  Lorraine Miller has a history of diabetes mellitus for at least 14 years, as well as a history of hypertension, hyperlipidemia, and COPD.  Approximately 7-8 years ago.  She apparently had a nuclear stress test, which raised  concern and definitive cardiac catheterization done in [facility] revealed normal coronary arteries.  She has recently experienced 2 episodes of presyncope.  On both episodes she was  standing.  On exam today, she is not orthostatic with a blood pressure taken by me at 122/70 supine and 116/70 standing.  He has been taking losartan at just 25 mg daily in the morning and she has a prescription for furosemide 40 mg which he takes as needed for swelling.  Have suggested she take a reduced dose of furosemide at 20 mg rather than 40 on a when necessary basis.  I also suggested she change her losartan timing in take this prior to going to bed.  She has a history of COPD and is on Symbicort in addition to Spiriva.  She is diabetic on metformin.  She has been taking lovastatin 20 mg for hyperlipidemia.  I have reviewed recent blood work done at [facility].  She does not have renal failure, but actually had stage II renal staging with a BUN of 11 and creatinine of 0.71.  Her most recent lipid studies revealed a total cholesterol 168, triglycerides 108, HDL 60, LDL 86.  Thyroid function was normal.  With her episodes of presyncope.  I am scheduling her for a 2-D echo Doppler study.  I will also schedule her for carotid duplex imaging to make certain there is no vertebrobasilar insufficiency.  I will repeat fasting laboratory prior to her next office visit.  I will see her in 6 weeks for reevaluation.   Medication Adjustments/Labs and Tests Ordered: Current medicines are reviewed at length with the patient today.  Concerns regarding medicines are outlined above.  Medication changes, Labs and Tests ordered today are listed in the Patient Instructions below. Patient Instructions  Medication Instructions:   No changes  Labwork:  CBC, CMET, TSH, LIPID, A1C  Testing/Procedures: Your physician has requested that you have an echocardiogram. Echocardiography is a painless test that uses sound waves to  create images of your heart. It provides your doctor with information about the size and shape of your heart and how well your heart's chambers and valves are working. This procedure takes approximately one hour. There are no restrictions for this procedure.  Your physician has requested that you have a carotid duplex. This test is an ultrasound of the carotid arteries in your neck. It looks at blood flow through these arteries that supply the brain with blood. Allow one hour for this exam. There are no restrictions or special instructions.    Follow-Up:  6 WEEKS  Any Other Special Instructions Will Be Listed Below (If Applicable).      Signed, Shelva Majestic, MD , [facility] 07/03/2016 8:41 PM    [place] [address], [place], [place], [place]  [address] Phone: [phone]

## 2016-07-02 NOTE — Patient Instructions (Signed)
Medication Instructions:   No changes  Labwork:  CBC, CMET, TSH, LIPID, A1C  Testing/Procedures: Your physician has requested that you have an echocardiogram. Echocardiography is a painless test that uses sound waves to create images of your heart. It provides your doctor with information about the size and shape of your heart and how well your heart's chambers and valves are working. This procedure takes approximately one hour. There are no restrictions for this procedure.  Your physician has requested that you have a carotid duplex. This test is an ultrasound of the carotid arteries in your neck. It looks at blood flow through these arteries that supply the brain with blood. Allow one hour for this exam. There are no restrictions or special instructions.    Follow-Up:  6 WEEKS  Any Other Special Instructions Will Be Listed Below (If Applicable).

## 2016-07-04 DIAGNOSIS — I10 Essential (primary) hypertension: Secondary | ICD-10-CM | POA: Diagnosis not present

## 2016-07-04 DIAGNOSIS — E784 Other hyperlipidemia: Secondary | ICD-10-CM | POA: Diagnosis not present

## 2016-07-04 DIAGNOSIS — E109 Type 1 diabetes mellitus without complications: Secondary | ICD-10-CM | POA: Diagnosis not present

## 2016-07-04 DIAGNOSIS — Z79899 Other long term (current) drug therapy: Secondary | ICD-10-CM | POA: Diagnosis not present

## 2016-07-04 LAB — CBC
HEMATOCRIT: 40.7 % (ref 35.0–45.0)
HEMOGLOBIN: 13.9 g/dL (ref 11.7–15.5)
MCH: 29.9 pg (ref 27.0–33.0)
MCHC: 34.2 g/dL (ref 32.0–36.0)
MCV: 87.5 fL (ref 80.0–100.0)
MPV: 10.5 fL (ref 7.5–12.5)
Platelets: 343 10*3/uL (ref 140–400)
RBC: 4.65 MIL/uL (ref 3.80–5.10)
RDW: 13.3 % (ref 11.0–15.0)
WBC: 6.1 10*3/uL (ref 3.8–10.8)

## 2016-07-04 LAB — COMPREHENSIVE METABOLIC PANEL
ALBUMIN: 4.1 g/dL (ref 3.6–5.1)
ALT: 24 U/L (ref 6–29)
AST: 18 U/L (ref 10–35)
Alkaline Phosphatase: 55 U/L (ref 33–130)
BUN: 19 mg/dL (ref 7–25)
CALCIUM: 8.9 mg/dL (ref 8.6–10.4)
CHLORIDE: 104 mmol/L (ref 98–110)
CO2: 28 mmol/L (ref 20–31)
Creat: 0.83 mg/dL (ref 0.60–0.93)
Glucose, Bld: 117 mg/dL — ABNORMAL HIGH (ref 65–99)
Potassium: 4.3 mmol/L (ref 3.5–5.3)
SODIUM: 140 mmol/L (ref 135–146)
Total Bilirubin: 1.6 mg/dL — ABNORMAL HIGH (ref 0.2–1.2)
Total Protein: 6.3 g/dL (ref 6.1–8.1)

## 2016-07-04 LAB — LIPID PANEL
Cholesterol: 161 mg/dL (ref <200)
HDL: 47 mg/dL — AB (ref >50)
LDL CALC: 85 mg/dL (ref <100)
TRIGLYCERIDES: 147 mg/dL (ref <150)
Total CHOL/HDL Ratio: 3.4 Ratio (ref <5.0)
VLDL: 29 mg/dL (ref <30)

## 2016-07-04 LAB — HEMOGLOBIN A1C
Hgb A1c MFr Bld: 6 % — ABNORMAL HIGH (ref <5.7)
Mean Plasma Glucose: 126 mg/dL

## 2016-07-04 LAB — TSH: TSH: 3.35 mIU/L

## 2016-07-16 ENCOUNTER — Ambulatory Visit (HOSPITAL_COMMUNITY): Payer: PPO | Attending: Cardiology

## 2016-07-16 ENCOUNTER — Other Ambulatory Visit: Payer: Self-pay

## 2016-07-16 DIAGNOSIS — I501 Left ventricular failure: Secondary | ICD-10-CM | POA: Diagnosis not present

## 2016-07-16 DIAGNOSIS — I10 Essential (primary) hypertension: Secondary | ICD-10-CM

## 2016-07-16 DIAGNOSIS — I358 Other nonrheumatic aortic valve disorders: Secondary | ICD-10-CM | POA: Insufficient documentation

## 2016-07-16 LAB — ECHOCARDIOGRAM COMPLETE
E decel time: 211 msec
EERAT: 9.34
FS: 36 % (ref 28–44)
IV/PV OW: 1.03
LA vol index: 14.5 mL/m2
LADIAMINDEX: 2.01 cm/m2
LASIZE: 36 mm
LAVOL: 26 mL
LAVOLA4C: 31 mL
LEFT ATRIUM END SYS DIAM: 36 mm
LV E/e' medial: 9.34
LV E/e'average: 9.34
LV e' LATERAL: 8.88 cm/s
LVOT VTI: 20.1 cm
LVOT area: 3.14 cm2
LVOT diameter: 20 mm
LVOT peak grad rest: 4 mmHg
LVOT peak vel: 99.3 cm/s
LVOTSV: 63 mL
MV Dec: 211
MVPG: 3 mmHg
MVPKAVEL: 80.9 m/s
MVPKEVEL: 82.9 m/s
PW: 9.38 mm — AB (ref 0.6–1.1)
TDI e' lateral: 8.88
TDI e' medial: 8.11

## 2016-07-19 ENCOUNTER — Ambulatory Visit (HOSPITAL_COMMUNITY)
Admission: RE | Admit: 2016-07-19 | Discharge: 2016-07-19 | Disposition: A | Discharge status: Home / Self Care | Payer: PPO | Source: Ambulatory Visit | Attending: Cardiology | Admitting: Cardiology

## 2016-07-19 DIAGNOSIS — I6523 Occlusion and stenosis of bilateral carotid arteries: Secondary | ICD-10-CM | POA: Diagnosis not present

## 2016-07-19 DIAGNOSIS — R0989 Other specified symptoms and signs involving the circulatory and respiratory systems: Secondary | ICD-10-CM | POA: Diagnosis not present

## 2016-08-14 ENCOUNTER — Ambulatory Visit (INDEPENDENT_AMBULATORY_CARE_PROVIDER_SITE_OTHER): Payer: PPO | Admitting: Cardiovascular Disease

## 2016-08-14 ENCOUNTER — Encounter: Payer: Self-pay | Admitting: Cardiovascular Disease

## 2016-08-14 VITALS — BP 119/71 | HR 81 | Ht 66.5 in | Wt 153.0 lb

## 2016-08-14 DIAGNOSIS — E785 Hyperlipidemia, unspecified: Secondary | ICD-10-CM | POA: Diagnosis not present

## 2016-08-14 DIAGNOSIS — E109 Type 1 diabetes mellitus without complications: Secondary | ICD-10-CM

## 2016-08-14 DIAGNOSIS — Z79899 Other long term (current) drug therapy: Secondary | ICD-10-CM | POA: Diagnosis not present

## 2016-08-14 DIAGNOSIS — I10 Essential (primary) hypertension: Secondary | ICD-10-CM

## 2016-08-14 MED ORDER — ROSUVASTATIN CALCIUM 10 MG PO TABS: 10.0000 mg | ORAL_TABLET | Freq: Every day | ORAL | 3 refills | Status: DC

## 2016-08-14 NOTE — Patient Instructions (Signed)
Your physician recommends that you return for lab work in: 3 months.  Your physician has recommended you make the following change in your medication:   1.) STOP the lovastatin. This has been replaced with generic crestor.  Your physician wants you to follow-up in: 6 months or sooner if needed. You will receive a reminder letter in the mail two months in advance. If you don't receive a letter, please call our office to schedule the follow-up appointment.  If you need a refill on your cardiac medications before your next appointment, please call your pharmacy.

## 2016-08-14 NOTE — Progress Notes (Signed)
Cardiology Office Note    Date:  08/16/2016   ID:  Lorraine Miller, DOB December 01, 1945, MRN [id number]  PCP:  Thressa Sheller, MD  Cardiologist:  Shelva Majestic, MD   Chief Complaint  Patient presents with  . Follow-up    6 weeks;  . Edema    in feet and hands occasionally.    History of Present Illness:  Lorraine Miller is a 72 y.o. female who established cardiology care with me in January 2018.  She presents for follow-up evaluation.  Lorraine Miller has a history of diabetes mellitus for 14 years, COPD, diabetes mellitus, hyperlipidemia, as well as depression.  Approximately 8 years ago, she had undergone a stress test by Dr. Kerrin Mo at the [facility] in [place] which was abnormal and ultimately had a heart catheterization by Dr.[name]..  She was told that her coronaries were normal at that time.  She has a history of breast CA in situ and underwent double mastectomy with subsequent breast reconstructive surgery.  On 05/01/2016 was she was cutting her husband's hair.  She became very lightheaded and presyncopal.  Reportedly her husband had taken her blood pressure which was very low.  By the time EMS arrived, the patient states EMS blood pressure recording was 90/55.  She presented to [facility] hospital and on presentation her blood pressure had risen to 115/52.  CT scan did not reveal any acute abnormality.  She had mild left frontal sinus disease.  A chest x-ray showed COPD with mild bibasal atelectasis glass scarring.  Her ECG was unremarkable.  Troponins were negative.  She again had another short-lived episode of presyncope on New Year's Day, which ultimately resolved.  She is uncertain if she was dehydrated on both of these occasions.  She denied associated chest tightness.  She saw me initially in January 2018 after she had seen on the [facility] record that she may have   mild renal failure and heart failure.  The patient states she was never told this and presented for  cardiologic evaluation.  I reassured her after reviewing the [place] record laboratory that she did not have renal failure, but actually had stage II renal staging.  I scheduled her for 2-D echo Doppler study which was done 03/15/2017 and showed an EF of 55-60%.  There was mild grade 1 diastolic dysfunction with aortic valve sclerosis.  She underwent follow-up laboratory on February 1 18, which showed a creatinine of 0.83.  Lipid studies were notable for total cholesterol 161, HDL 47, LDL 85, and triglycerides 147.  She has been on lovastatin.  She presents for evaluation.  Past Medical History:  Diagnosis Date  . Arthritis   . Cancer [facility])    Breast cancer  . Colon polyp   . COPD (chronic obstructive pulmonary disease) ([place])   . Depression   . Diabetes mellitus without complication [facility])     Past Surgical History:  Procedure Laterality Date  . ABDOMINAL HYSTERECTOMY    . ANTERIOR CERVICAL DISCECTOMY    . APPENDECTOMY    . BREAST LUMPECTOMY    . double mastectomy    . PARATHYROIDECTOMY    . POSTERIOR CERVICAL FUSION/FORAMINOTOMY    . surgical breast biopsy    . TONSILLECTOMY      Current Medications: Outpatient Medications Prior to Visit  Medication Sig Dispense Refill  . albuterol (PROVENTIL HFA) 108 (90 BASE) MCG/ACT inhaler Inhale 2 puffs into the lungs every 6 (six) hours as needed for wheezing or shortness  of breath.    . budesonide-formoterol (SYMBICORT) 80-4.5 MCG/ACT inhaler Inhale 2 puffs into the lungs 2 (two) times daily. 1 Inhaler 5  . Cholecalciferol (VITAMIN D) 2000 UNITS tablet Take 2,000 Units by mouth daily.    . furosemide (LASIX) 40 MG tablet Take 1 tablet (40 mg total) by mouth daily as needed for edema. 30 tablet 2  . losartan (COZAAR) 25 MG tablet Take 1 tablet (25 mg total) by mouth daily. 90 tablet 0  . Magnesium 400 MG CAPS Take 400 mg by mouth.    . metFORMIN (GLUCOPHAGE) 500 MG tablet Take 1 tablet (500 mg total) by mouth 2 (two) times daily  with a meal. 60 tablet 2  . potassium chloride (K-DUR) 10 MEQ tablet Take 1 tablet (10 mEq total) by mouth daily. 30 tablet 2  . tiotropium (SPIRIVA) 18 MCG inhalation capsule Place 1 capsule (18 mcg total) into inhaler and inhale daily. 30 capsule 3  . clonazePAM (KLONOPIN) 0.5 MG tablet Take 0.5-1 tablets (0.25-0.5 mg total) by mouth 2 (two) times daily as needed for anxiety. 60 tablet 0  . lovastatin (MEVACOR) 20 MG tablet Take 1 tablet (20 mg total) by mouth at bedtime. 30 tablet 0   No facility-administered medications prior to visit.      Allergies:   Abilify [aripiprazole]; Ambien [zolpidem tartrate]; and Gabapentin   Social History   Social History  . Marital status: Married    Spouse name: N/A  . Number of children: 2  . Years of education: 84   Social History Main Topics  . Smoking status: Former Smoker    Packs/day: 0.25    Years: 28.00    Types: Cigarettes    Quit date: 06/03/2001  . Smokeless tobacco: Never Used  . Alcohol use No  . Drug use: No  . Sexual activity: Not Asked   Other Topics Concern  . None   Social History Narrative   Fun: Scientist, research (life sciences) and crafts   Denies religious beliefs effecting health care.   Denies abuse and feels safe at home.      Family History:  The patient's family history includes Diabetes in her father and paternal grandfather; Healthy in her maternal grandfather, maternal grandmother, and paternal grandfather; Heart disease in her father; Multiple myeloma in her mother; Stomach cancer in her paternal grandmother.   ROS General: Negative; No fevers, chills, or night sweats;  HEENT: Negative; No changes in vision or hearing, sinus congestion, difficulty swallowing Pulmonary: Negative; No cough, wheezing, shortness of breath, hemoptysis Cardiovascular: See history of present illness GI: Negative; No nausea, vomiting, diarrhea, or abdominal pain GU: Negative; No dysuria, hematuria, or difficulty voiding Musculoskeletal: Negative; no  myalgias, joint pain, or weakness Hematologic/Oncology: Negative; no easy bruising, bleeding Endocrine: Negative; no heat/cold intolerance; no diabetes Neuro: Negative; no changes in balance, headaches Skin: Negative; No rashes or skin lesions Psychiatric: Negative; No behavioral problems, depression Sleep: Negative; No snoring, daytime sleepiness, hypersomnolence, bruxism, restless legs, hypnogognic hallucinations, no cataplexy Other comprehensive 14 point system review is negative.   PHYSICAL EXAM:   VS:  BP 119/71   Pulse 81   Ht 5' 6.5" (1.689 m)   Wt 153 lb (69.4 kg)   BMI 24.32 kg/m    Wt Readings from Last 3 Encounters:  08/14/16 153 lb (69.4 kg)  07/02/16 154 lb (69.9 kg)  05/01/16 154 lb (69.9 kg)    General: Alert, oriented, no distress.  Skin: normal turgor, no rashes, warm and dry HEENT: Normocephalic, atraumatic.  Pupils equal round and reactive to light; sclera anicteric; extraocular muscles intact; Fundi No hemorrhages or exudates Nose without nasal septal hypertrophy Mouth/Parynx benign; Mallinpatti scale 3 Neck: No JVD, no carotid bruits; normal carotid upstroke Lungs: clear to ausculatation and percussion; no wheezing or rales Chest wall: without tenderness to palpitation Heart: PMI not displaced, RRR, s1 s2 normal, 1/6 systolic murmur, no diastolic murmur, no rubs, gallops, thrills, or heaves Abdomen: soft, nontender; no hepatosplenomehaly, BS+; abdominal aorta nontender and not dilated by palpation. Back: no CVA tenderness Pulses 2+ Musculoskeletal: full range of motion, normal strength, no joint deformities Extremities: no clubbing cyanosis or edema, Homan's sign negative  Neurologic: grossly nonfocal; Cranial nerves grossly wnl Psychologic: Normal mood and affect   Studies/Labs Reviewed:   ECG (independently read by me): Normal sinus rhythm at 81 bpm.  Normal intervals.  No ectopy.  No ST segment changes.  January 2018 EKG:  EKG is ordered today.  ECG  (independently read by me): Normal sinus rhythm at 77 bpm.  No significant ST-T changes.  PR interval 114 ms; QRS duration 84 ms; QTc interval normal at 411 milliseconds.  Recent Labs: BMP Latest Ref Rng & Units 07/04/2016 05/01/2016 12/29/2013  Glucose 65 - 99 mg/dL 117(H) 110(H) 119(H)  BUN 7 - 25 mg/dL '19 10 10  ' Creatinine 0.60 - 0.93 mg/dL 0.83 0.77 0.80  Sodium 135 - 146 mmol/L 140 141 142  Potassium 3.5 - 5.3 mmol/L 4.3 3.8 3.7  Chloride 98 - 110 mmol/L 104 105 105  CO2 20 - 31 mmol/L 28 27 32  Calcium 8.6 - 10.4 mg/dL 8.9 9.4 8.7     Hepatic Function Latest Ref Rng & Units 07/04/2016 12/29/2013 12/20/2013  Total Protein 6.1 - 8.1 g/dL 6.3 - 6.9  Albumin 3.6 - 5.1 g/dL 4.1 3.5 3.9  AST 10 - 35 U/L 18 - 25  ALT 6 - 29 U/L 24 - 38  Alk Phosphatase 33 - 130 U/L 55 - 88  Total Bilirubin 0.2 - 1.2 mg/dL 1.6(H) - 1.0    CBC Latest Ref Rng & Units 07/04/2016 05/01/2016 12/20/2013  WBC 3.8 - 10.8 K/uL 6.1 10.5 8.7  Hemoglobin 11.7 - 15.5 g/dL 13.9 14.1 13.9  Hematocrit 35.0 - 45.0 % 40.7 41.5 42.3  Platelets 140 - 400 K/uL 343 330 320   Lab Results  Component Value Date   MCV 87.5 07/04/2016   MCV 87.7 05/01/2016   MCV 91 12/20/2013   Lab Results  Component Value Date   TSH 3.35 07/04/2016   Lab Results  Component Value Date   HGBA1C 6.0 (H) 07/04/2016     BNP No results found for: BNP  ProBNP No results found for: PROBNP   Lipid Panel     Component Value Date/Time   CHOL 161 07/04/2016 0901   TRIG 147 07/04/2016 0901   HDL 47 (L) 07/04/2016 0901   CHOLHDL 3.4 07/04/2016 0901   VLDL 29 07/04/2016 0901   LDLCALC 85 07/04/2016 0901     RADIOLOGY: No results found.   Additional studies/ records that were reviewed today include:  I reviewed the emergency room records as well as records from [facility], recent labs and echo.     ASSESSMENT:    1. Essential hypertension   2. Hyperlipidemia LDL goal <70   3. Medication management   4.  Essential hypertension, benign   5. Type 2 diabetes mellitus without complication [facility])      PLAN:  Lorraine Miller is  a female who has a history of diabetes mellitus for at least 14 years, as well as a history of hypertension, hyperlipidemia, and COPD.  A nuclear stress test done 8 years ago raised concern for ischemia and definitive cardiac catheterization done in [place] revealed normal coronary arteries.  She had experienced 2 episodes of presyncope while standing.  When I initially saw her, she was not orthostatic.  I reduced her diuretic regimen.  She does not have renal failure and does not have signs of CHF.  I reviewed her echo Doppler study in detail.  This shows normal systolic function although there was evidence for mild grade 1 diastolic dysfunction.  Her blood pressure today remains stable on her medical regimen consisting of losartan 25 mg.  I discussed with her recent data regarding asymptomatic patients with subclinical detected atherosclerosis with her most recent lipid panel, I have suggested she discontinue lovastatin and change to rosuvastatin 10 mg for more potent lowering agent (10 mg of Crestor is equivalent to 80 mg of Mevacor).  She is diabetic and target LDL is less than 70.   In 3 months follow-up lipid study and chemistry profile will be obtained.  She does not have renal failure.  She is asymptomatic with reference to dyspnea, or palpitations.  I will see her in 6 months for reevaluation.   Medication Adjustments/Labs and Tests Ordered: Current medicines are reviewed at length with the patient today.  Concerns regarding medicines are outlined above.  Medication changes, Labs and Tests ordered today are listed in the Patient Instructions below. Patient Instructions  Your physician recommends that you return for lab work in: 3 months.  Your physician has recommended you make the following change in your medication:   1.) STOP the lovastatin. This has been replaced  with generic crestor.  Your physician wants you to follow-up in: 6 months or sooner if needed. You will receive a reminder letter in the mail two months in advance. If you don't receive a letter, please call our office to schedule the follow-up appointment.  If you need a refill on your cardiac medications before your next appointment, please call your pharmacy.        Signed, Shelva Majestic, MD , [facility] 08/16/2016 12:35 PM    [place] [address] [place], [place],   [address] Phone: [phone]

## 2016-10-17 DIAGNOSIS — I509 Heart failure, unspecified: Secondary | ICD-10-CM | POA: Diagnosis not present

## 2016-10-17 DIAGNOSIS — E1122 Type 2 diabetes mellitus with diabetic chronic kidney disease: Secondary | ICD-10-CM | POA: Diagnosis not present

## 2016-10-17 DIAGNOSIS — J449 Chronic obstructive pulmonary disease, unspecified: Secondary | ICD-10-CM | POA: Diagnosis not present

## 2016-10-17 DIAGNOSIS — F339 Major depressive disorder, recurrent, unspecified: Secondary | ICD-10-CM | POA: Diagnosis not present

## 2016-10-24 ENCOUNTER — Ambulatory Visit (INDEPENDENT_AMBULATORY_CARE_PROVIDER_SITE_OTHER): Payer: PPO | Admitting: Internal Medicine

## 2016-10-24 ENCOUNTER — Encounter: Payer: Self-pay | Admitting: Internal Medicine

## 2016-10-24 VITALS — BP 124/70 | HR 87 | Ht 66.0 in | Wt 149.4 lb

## 2016-10-24 DIAGNOSIS — J449 Chronic obstructive pulmonary disease, unspecified: Secondary | ICD-10-CM

## 2016-10-24 NOTE — Progress Notes (Signed)
Subjective:     Patient ID: Lorraine Miller, female   DOB: May 04, 1946,   MRN: [id number]  HPI   61 yowf quit smoking 2003 referred to pulmonary clinic 10/24/2016 by Dr   Shelia Media for copd eval    10/24/2016 1st [facility] office visit/ [facility]   Chief Complaint  Patient presents with  . Pulmonary Consult    Referred by Dr. Deland Pretty for eval of COPD. Pt states she was dxed with COPD in 2015 and has been on inhalers since then. She states that she only has trouble breathing when carrying something heavy up a flight of stairs. She very rarely has to use albuterol.    new doe 2015 > eval by Jens Som in [place] > pfts by [facility] > GOLD II copd rx spiriva dpi/ symbicort   Doe = MMRC1 = can walk nl pace, flat grade, can't hurry or go uphills or steps s sob     No obvious day to day or daytime variability or assoc excess/ purulent sputum or mucus plugs or hemoptysis or cp or chest tightness, subjective wheeze or overt sinus or hb symptoms. No unusual exp hx or h/o childhood pna/ asthma or knowledge of premature birth.  Sleeping ok without nocturnal  or early am exacerbation  of respiratory  c/o's or need for noct saba. Also denies any obvious fluctuation of symptoms with weather or environmental changes or other aggravating or alleviating factors except as outlined above   Current Medications, Allergies, Complete Past Medical History, Past Surgical History, Family History, and Social History were reviewed in [employer] [employer] record.  ROS  The following are not active complaints unless bolded sore throat, dysphagia, dental problems, itching, sneezing,  nasal congestion or excess/ purulent secretions, ear ache,   fever, chills, sweats, unintended wt loss, classically pleuritic or exertional cp,  orthopnea pnd or leg swelling, presyncope, palpitations, abdominal pain, anorexia, nausea, vomiting, diarrhea  or change in bowel or bladder habits, change in stools or urine,  dysuria,hematuria,  rash, arthralgias, visual complaints, headache, numbness, weakness or ataxia or problems with walking or coordination,  change in mood/affect or memory.              Review of Systems     Objective:   Physical Exam   amb wm nad   Wt Readings from Last 3 Encounters:  10/24/16 149 lb 6.4 oz (67.8 kg)  08/14/16 153 lb (69.4 kg)  07/02/16 154 lb (69.9 kg)    Vital signs reviewed - Note on arrival 02 sats  97% on RA     HEENT: nl dentition, turbinates bilaterally, and oropharynx. Nl external ear canals without cough reflex   NECK :  without JVD/Nodes/TM/ nl carotid upstrokes bilaterally   LUNGS: no acc muscle use,  Nl contour chest which is clear to A and P bilaterally without cough on insp or exp maneuvers   CV:  RRR  no s3 or murmur or increase in P2, and no edema   ABD:  soft and nontender with nl inspiratory excursion in the supine position. No bruits or organomegaly appreciated, bowel sounds nl  MS:  Nl gait/ ext warm without deformities, calf tenderness, cyanosis or clubbing No obvious joint restrictions   SKIN: warm and dry without lesions    NEURO:  alert, approp, nl sensorium with  no motor or cerebellar deficits apparent.      I personally reviewed images and agree with radiology impression as follows:  CXR:  05/01/16 COPD with mild bibasilar atelectasis/scarring.      Assessment:

## 2016-10-24 NOTE — Patient Instructions (Addendum)
You have GOLD II copd severity  = moderate   Treatment is continue to breathe clean air and treat your symptoms   Stop spiriva and continue symbicort 80 Take 2 puffs first thing in am and then another 2 puffs about 12 hours later only if you feel you need it   Work on inhaler technique:  relax and gently blow all the way out then take a nice smooth deep breath back in, triggering the inhaler at same time you start breathing in.  Hold for up to 5 seconds if you can. Blow out thru nose. Rinse and gargle with water when done      Only use your albuterol as a rescue medication to be used if you can't catch your breath by resting or doing a relaxed purse lip breathing pattern.  - The less you use it, the better it will work when you need it. - Ok to use up to 2 puffs  every 4 hours if you must but call for immediate appointment if use goes up over your usual need - Don't leave home without it !!  (think of it like the spare tire for your car)   Return first week in November 2018 with pfts on return

## 2016-10-25 NOTE — Assessment & Plan Note (Addendum)
Quit smoking 2003 - PFT's  [facility]:  04/14/14   FEV1 1.43(60 %)  ratio 40  p 11 % improvement from saba p ? prior to study with DLCO  74 % corrects to 88  % for alv volume     - 10/24/2016  After extensive coaching HFA effectiveness =    75%  So rec d/c spiriva and change symb 80 to just 2 each am     I reviewed the Fletcher curve with the patient that basically indicates  if you quit smoking when your best day FEV1 is still  preserved (as was relatively  the case here)  it is highly unlikely you will progress to severe disease and informed the patient there was  no medication on the market that has proven to alter the curve/ its downward trajectory  or the likelihood of progression of their disease(unlike other chronic medical conditions such as atheroclerosis where we do think we can change the natural hx with risk reducing meds)    Therefore  maintaining abstinence is the most important aspect of care, not choice of inhalers or for that matter, doctors.     Rx  is for symptoms only at this point, not to change the natural hx of her dz,  and one she is presently suffering from is poverty from expensive meds so I rec simplify rx just using the symb 80  2 each am and if has same level of doe s tendency to aecopd then that dose will do, and if not would change to the same price 160 (which is the only dose approved for copd anyway but may not be needed here) before adding a second drug (LAMA)    Total time devoted to counseling  > 50 % of initial 60 min office visit:  review case with pt/ discussion of options/alternatives/ personally creating written customized instructions  in presence of pt  then going over those specific  Instructions directly with the pt including how to use all of the meds but in particular covering each new medication in detail and the difference between the maintenance= "automatic" meds and the prns using an action plan format for the latter (If this problem/symptom => do that  organization reading Left to right).  Please see AVS from this visit for a full list of these instructions which I personally wrote for this pt and  are unique to this visit.

## 2016-11-14 DIAGNOSIS — E785 Hyperlipidemia, unspecified: Secondary | ICD-10-CM | POA: Diagnosis not present

## 2016-11-14 DIAGNOSIS — Z79899 Other long term (current) drug therapy: Secondary | ICD-10-CM | POA: Diagnosis not present

## 2016-11-15 LAB — COMPREHENSIVE METABOLIC PANEL
ALBUMIN: 4.3 g/dL (ref 3.6–5.1)
ALT: 42 U/L — AB (ref 6–29)
AST: 28 U/L (ref 10–35)
Alkaline Phosphatase: 60 U/L (ref 33–130)
BILIRUBIN TOTAL: 1.2 mg/dL (ref 0.2–1.2)
BUN: 18 mg/dL (ref 7–25)
CO2: 27 mmol/L (ref 20–31)
CREATININE: 0.84 mg/dL (ref 0.60–0.93)
Calcium: 9.3 mg/dL (ref 8.6–10.4)
Chloride: 103 mmol/L (ref 98–110)
Glucose, Bld: 116 mg/dL — ABNORMAL HIGH (ref 65–99)
Potassium: 4.6 mmol/L (ref 3.5–5.3)
SODIUM: 139 mmol/L (ref 135–146)
TOTAL PROTEIN: 6.2 g/dL (ref 6.1–8.1)

## 2016-11-15 LAB — LIPID PANEL
Cholesterol: 129 mg/dL (ref <200)
HDL: 58 mg/dL (ref >50)
LDL Cholesterol: 56 mg/dL (ref <100)
Total CHOL/HDL Ratio: 2.2 Ratio (ref <5.0)
Triglycerides: 77 mg/dL (ref <150)
VLDL: 15 mg/dL (ref <30)

## 2016-11-18 ENCOUNTER — Encounter: Payer: Self-pay | Admitting: *Deleted

## 2016-11-18 ENCOUNTER — Other Ambulatory Visit: Payer: Self-pay | Admitting: *Deleted

## 2016-11-18 DIAGNOSIS — R740 Nonspecific elevation of levels of transaminase and lactic acid dehydrogenase [LDH]: Principal | ICD-10-CM

## 2016-11-18 DIAGNOSIS — R7401 Elevation of levels of liver transaminase levels: Secondary | ICD-10-CM

## 2016-11-21 DIAGNOSIS — J449 Chronic obstructive pulmonary disease, unspecified: Secondary | ICD-10-CM | POA: Diagnosis not present

## 2016-11-21 DIAGNOSIS — F339 Major depressive disorder, recurrent, unspecified: Secondary | ICD-10-CM | POA: Diagnosis not present

## 2016-12-16 DIAGNOSIS — R740 Nonspecific elevation of levels of transaminase and lactic acid dehydrogenase [LDH]: Secondary | ICD-10-CM | POA: Diagnosis not present

## 2016-12-17 LAB — HEPATIC FUNCTION PANEL
ALBUMIN: 4.7 g/dL (ref 3.5–4.8)
ALT: 30 IU/L (ref 0–32)
AST: 25 IU/L (ref 0–40)
Alkaline Phosphatase: 61 IU/L (ref 39–117)
BILIRUBIN TOTAL: 0.9 mg/dL (ref 0.0–1.2)
Bilirubin, Direct: 0.24 mg/dL (ref 0.00–0.40)
Total Protein: 6.2 g/dL (ref 6.0–8.5)

## 2017-01-10 DIAGNOSIS — R3915 Urgency of urination: Secondary | ICD-10-CM | POA: Diagnosis not present

## 2017-01-10 DIAGNOSIS — R300 Dysuria: Secondary | ICD-10-CM | POA: Diagnosis not present

## 2017-03-04 ENCOUNTER — Encounter: Payer: Self-pay | Admitting: Cardiovascular Disease

## 2017-03-04 ENCOUNTER — Ambulatory Visit (INDEPENDENT_AMBULATORY_CARE_PROVIDER_SITE_OTHER): Payer: PPO | Admitting: Cardiovascular Disease

## 2017-03-04 VITALS — BP 130/60 | HR 80 | Ht 66.0 in | Wt 145.0 lb

## 2017-03-04 DIAGNOSIS — R002 Palpitations: Secondary | ICD-10-CM

## 2017-03-04 DIAGNOSIS — I10 Essential (primary) hypertension: Secondary | ICD-10-CM | POA: Diagnosis not present

## 2017-03-04 DIAGNOSIS — E785 Hyperlipidemia, unspecified: Secondary | ICD-10-CM

## 2017-03-04 DIAGNOSIS — E109 Type 1 diabetes mellitus without complications: Secondary | ICD-10-CM

## 2017-03-04 MED ORDER — METOPROLOL SUCCINATE ER 25 MG PO TB24: 12.5000 mg | ORAL_TABLET | Freq: Every day | ORAL | 3 refills | Status: DC

## 2017-03-04 NOTE — Progress Notes (Signed)
Cardiology Office Note    Date:  03/06/2017   ID:  [name], DOB 1945/10/05, MRN [id number]  PCP:  Deland Pretty, MD  Cardiologist:  Shelva Majestic, MD   No chief complaint on file.   History of Present Illness:  Lorraine Miller is a 71 y.o. female who established cardiology care with me in January 2018. I saw Lorraine Miller for initial follow-up in March 2018. Lorraine Miller presents for a 6 month follow-up evaluation.  Lorraine Miller has a history of diabetes mellitus for 14 years, COPD, diabetes mellitus, hyperlipidemia, as well as depression.  Approximately 8 years ago, Lorraine Miller had undergone a stress test by Dr. Kerrin Mo at the [facility] in [place] which was abnormal and ultimately had a heart catheterization by Dr.[name]..  Lorraine Miller was told that Lorraine Miller coronaries were normal at that time.  Lorraine Miller has a history of breast CA in situ and underwent double mastectomy with subsequent breast reconstructive surgery.  On 05/01/2016 was Lorraine Miller was cutting Lorraine Miller husband's hair.  Lorraine Miller became very lightheaded and presyncopal.  Reportedly Lorraine Miller husband had taken Lorraine Miller blood pressure which was very low.  By the time EMS arrived, the patient states EMS blood pressure recording was 90/55.  Lorraine Miller presented to [facility] hospital and on presentation Lorraine Miller blood pressure had risen to 115/52.  CT scan did not reveal any acute abnormality.  Lorraine Miller had mild left frontal sinus disease.  A chest x-ray showed COPD with mild bibasal atelectasis glass scarring.  Lorraine Miller ECG was unremarkable.  Troponins were negative.  Lorraine Miller again had another short-lived episode of presyncope on New Year's Day, which ultimately resolved.  Lorraine Miller is uncertain if Lorraine Miller was dehydrated on both of these occasions.  Lorraine Miller denied associated chest tightness.  Lorraine Miller saw me initially in January 2018 after Lorraine Miller had seen on the [facility] record that Lorraine Miller may have  mild renal failure and heart failure.  The patient states Lorraine Miller was never told this and presented for cardiologic evaluation.  I reassured  Lorraine Miller after reviewing the [place] record laboratory that Lorraine Miller did not have renal failure, but actually had stage II renal staging.  I scheduled Lorraine Miller for 2-D echo Doppler study which was done 03/15/2017 and showed an EF of 55-60%.  There was mild grade 1 diastolic dysfunction with aortic valve sclerosis.  Lorraine Miller underwent follow-up laboratory on February 1 18, which showed a creatinine of 0.83.  Lipid studies were notable for total cholesterol 161, HDL 47, LDL 85, and triglycerides 147.    When I last saw Lorraine Miller, I recommended that Lorraine Miller discontinue lovastatin and changed rosuvastatin 10 mg more pulling LDL lowering.  Lorraine Miller is now reestablished with Dr. Shelia Media for primary care since Lorraine Miller previous doctors still on sick leave.  Lorraine Miller notes occasional palpitations.  And when Lorraine Miller experiences this typically Lorraine Miller believes Lorraine Miller pulse may be over 90.  These seem to occur 1-2 times per week and last for several minutes.  Lorraine Miller denies chest pressure.  Lorraine Miller presents for evaluation.   Past Medical History:  Diagnosis Date  . Arthritis   . Cancer [facility])    Breast cancer  . Colon polyp   . COPD (chronic obstructive pulmonary disease) ([place])   . Depression   . Diabetes mellitus without complication [facility])     Past Surgical History:  Procedure Laterality Date  . ABDOMINAL HYSTERECTOMY    . ANTERIOR CERVICAL DISCECTOMY    . APPENDECTOMY    . BREAST LUMPECTOMY    . double mastectomy    .  PARATHYROIDECTOMY    . POSTERIOR CERVICAL FUSION/FORAMINOTOMY    . surgical breast biopsy    . TONSILLECTOMY      Current Medications: Outpatient Medications Prior to Visit  Medication Sig Dispense Refill  . albuterol (PROVENTIL HFA) 108 (90 BASE) MCG/ACT inhaler Inhale 2 puffs into the lungs every 6 (six) hours as needed for wheezing or shortness of breath.    . Cholecalciferol (VITAMIN D) 2000 UNITS tablet Take 2,000 Units by mouth daily.    . citalopram (CELEXA) 40 MG tablet Take 20 mg by mouth daily.    . furosemide (LASIX) 40  MG tablet Take 1 tablet (40 mg total) by mouth daily as needed for edema. 30 tablet 2  . losartan (COZAAR) 25 MG tablet Take 1 tablet (25 mg total) by mouth daily. 90 tablet 0  . Magnesium 400 MG CAPS Take 400 mg by mouth.    . metFORMIN (GLUCOPHAGE) 500 MG tablet Take 1 tablet (500 mg total) by mouth 2 (two) times daily with a meal. 60 tablet 2  . potassium chloride (K-DUR) 10 MEQ tablet Take 1 tablet (10 mEq total) by mouth daily. (Patient taking differently: Take 10 mEq by mouth daily. Only when Lorraine Miller takes lasix) 30 tablet 2  . budesonide-formoterol (SYMBICORT) 80-4.5 MCG/ACT inhaler Inhale 2 puffs into the lungs 2 (two) times daily. 1 Inhaler 5  . rosuvastatin (CRESTOR) 10 MG tablet Take 1 tablet (10 mg total) by mouth daily. 90 tablet 3  . tiotropium (SPIRIVA) 18 MCG inhalation capsule Place 1 capsule (18 mcg total) into inhaler and inhale daily. 30 capsule 3   No facility-administered medications prior to visit.      Allergies:   Abilify [aripiprazole]; Ambien [zolpidem tartrate]; and Gabapentin   Social History   Social History  . Marital status: Married    Spouse name: N/A  . Number of children: 2  . Years of education: 68   Social History Main Topics  . Smoking status: Former Smoker    Packs/day: 0.25    Years: 28.00    Types: Cigarettes    Quit date: 06/03/2001  . Smokeless tobacco: Never Used  . Alcohol use No  . Drug use: No  . Sexual activity: Not Asked   Other Topics Concern  . None   Social History Narrative   Fun: Scientist, research (life sciences) and crafts   Denies religious beliefs effecting health care.   Denies abuse and feels safe at home.      Family History:  The patient's family history includes Asthma in Lorraine Miller daughter, maternal grandfather, mother, and sister; Diabetes in Lorraine Miller father and paternal grandfather; Healthy in Lorraine Miller maternal grandfather, maternal grandmother, and paternal grandfather; Heart disease in Lorraine Miller father; Multiple myeloma in Lorraine Miller mother; Stomach cancer in Lorraine Miller  paternal grandmother.   ROS General: Negative; No fevers, chills, or night sweats;  HEENT: Negative; No changes in vision or hearing, sinus congestion, difficulty swallowing Pulmonary: Negative; No cough, wheezing, shortness of breath, hemoptysis Cardiovascular: See history of present illness GI: Negative; No nausea, vomiting, diarrhea, or abdominal pain GU: Negative; No dysuria, hematuria, or difficulty voiding Musculoskeletal: Negative; no myalgias, joint pain, or weakness Hematologic/Oncology: Negative; no easy bruising, bleeding Endocrine: Negative; no heat/cold intolerance; no diabetes Neuro: Negative; no changes in balance, headaches Skin: Negative; No rashes or skin lesions Psychiatric: Negative; No behavioral problems, depression Sleep: Negative; No snoring, daytime sleepiness, hypersomnolence, bruxism, restless legs, hypnogognic hallucinations, no cataplexy Other comprehensive 14 point system review is negative.   PHYSICAL EXAM:  VS:  BP 130/60   Pulse 80   Ht '5\' 6"'  (1.676 m)   Wt 145 lb (65.8 kg)   BMI 23.40 kg/m     Repeat blood pressure by me was 120/68  Wt Readings from Last 3 Encounters:  03/04/17 145 lb (65.8 kg)  10/24/16 149 lb 6.4 oz (67.8 kg)  08/14/16 153 lb (69.4 kg)    General: Alert, oriented, no distress.  Skin: normal turgor, no rashes, warm and dry HEENT: Normocephalic, atraumatic. Pupils equal round and reactive to light; sclera anicteric; extraocular muscles intact; Nose without nasal septal hypertrophy Mouth/Parynx benign; Mallinpatti scale 3 Neck: No JVD, no carotid bruits; normal carotid upstroke Lungs: clear to ausculatation and percussion; no wheezing or rales Chest wall: without tenderness to palpitation Heart: PMI not displaced, RRR, s1 s2 normal, 1/6 systolic murmur, no diastolic murmur, no rubs, gallops, thrills, or heaves Abdomen: soft, nontender; no hepatosplenomehaly, BS+; abdominal aorta nontender and not dilated by  palpation. Back: no CVA tenderness Pulses 2+ Musculoskeletal: full range of motion, normal strength, no joint deformities Extremities: no clubbing cyanosis or edema, Homan's sign negative  Neurologic: grossly nonfocal; Cranial nerves grossly wnl Psychologic: Normal mood and affect   Studies/Labs Reviewed:   ECG (independently read by me): normal sinus rhythm at 80 bpm.  Normal intervals.  No ectopy.  March 2018 ECG (independently read by me): Normal sinus rhythm at 81 bpm.  Normal intervals.  No ectopy.  No ST segment changes.  January 2018 EKG:  EKG is ordered today.  ECG (independently read by me): Normal sinus rhythm at 77 bpm.  No significant ST-T changes.  PR interval 114 ms; QRS duration 84 ms; QTc interval normal at 411 milliseconds.  Recent Labs: BMP Latest Ref Rng & Units 11/14/2016 07/04/2016 05/01/2016  Glucose 65 - 99 mg/dL 116(H) 117(H) 110(H)  BUN 7 - 25 mg/dL '18 19 10  ' Creatinine 0.60 - 0.93 mg/dL 0.84 0.83 0.77  Sodium 135 - 146 mmol/L 139 140 141  Potassium 3.5 - 5.3 mmol/L 4.6 4.3 3.8  Chloride 98 - 110 mmol/L 103 104 105  CO2 20 - 31 mmol/L '27 28 27  ' Calcium 8.6 - 10.4 mg/dL 9.3 8.9 9.4     Hepatic Function Latest Ref Rng & Units 12/16/2016 11/14/2016 07/04/2016  Total Protein 6.0 - 8.5 g/dL 6.2 6.2 6.3  Albumin 3.5 - 4.8 g/dL 4.7 4.3 4.1  AST 0 - 40 IU/L '25 28 18  ' ALT 0 - 32 IU/L 30 42(H) 24  Alk Phosphatase 39 - 117 IU/L 61 60 55  Total Bilirubin 0.0 - 1.2 mg/dL 0.9 1.2 1.6(H)  Bilirubin, Direct 0.00 - 0.40 mg/dL 0.24 - -    CBC Latest Ref Rng & Units 07/04/2016 05/01/2016 12/20/2013  WBC 3.8 - 10.8 K/uL 6.1 10.5 8.7  Hemoglobin 11.7 - 15.5 g/dL 13.9 14.1 13.9  Hematocrit 35.0 - 45.0 % 40.7 41.5 42.3  Platelets 140 - 400 K/uL 343 330 320   Lab Results  Component Value Date   MCV 87.5 07/04/2016   MCV 87.7 05/01/2016   MCV 91 12/20/2013   Lab Results  Component Value Date   TSH 3.35 07/04/2016   Lab Results  Component Value Date   HGBA1C 6.0 (H)  07/04/2016     BNP No results found for: BNP  ProBNP No results found for: PROBNP   Lipid Panel     Component Value Date/Time   CHOL 129 11/14/2016 0825   TRIG 77 11/14/2016 0825  HDL 58 11/14/2016 0825   CHOLHDL 2.2 11/14/2016 0825   VLDL 15 11/14/2016 0825   LDLCALC 56 11/14/2016 0825     RADIOLOGY: No results found.   Additional studies/ records that were reviewed today include:  I reviewed the emergency room records as well as records from [facility], recent labs and echo.     ASSESSMENT:    1. Essential hypertension   2. Hyperlipidemia LDL goal <70   3. Type 2 diabetes mellitus without complication (HCC)   4. Palpitations     PLAN:  Lorraine Miller is a 71 year old female who has a history of diabetes mellitus for at least 14 years, as well as a history of hypertension, hyperlipidemia, and COPD.  A nuclear stress test done over 8 years ago raised concern for ischemia and definitive cardiac catheterization done in [place] revealed normal coronary arteries.  Lorraine Miller had experienced 2 episodes of presyncope while standing.  When I initially saw Lorraine Miller, Lorraine Miller was not orthostatic.  I reduced Lorraine Miller diuretic regimen.  Lorraine Miller does not have renal failure and does not have signs of CHF.  Lorraine Miller echo Doppler revealed normal systolic function although there was evidence for mild grade 1 diastolic dysfunction.  Lorraine Miller blood pressure today is stable without orthostatic change.  Lorraine Miller has been on losartan 25 mg daily and rarely has taken furosemide.  Lorraine Miller is now on rosuvastatin in attempt to be more aggressive with Lorraine Miller lipid status.  This has significant improved and laboratory 3 months ago now showed an LDL cholesterol of 56.  I again reviewed dated with reference to subclinical atherosclerosis.  Lorraine Miller is diabetic and target LDL is less than 70.  Because of Lorraine Miller occasional palpitations, and elected to add very low-dose metoprolol succinate at 12.5 mg daily to see if these can improve  Lorraine Miller frequent symptoms.  Lorraine Miller will be following up with Dr. Shelia Media.  I will see Lorraine Miller in 6 months for reevaluation.   Medication Adjustments/Labs and Tests Ordered: Current medicines are reviewed at length with the patient today.  Concerns regarding medicines are outlined above.  Medication changes, Labs and Tests ordered today are listed in the Patient Instructions below. Patient Instructions  Medication Instructions:  START metoprolol succinate (Toprol XL) 12.5 mg (1/2 tablet) daily --if you continue to have palpitations increase to 1 tablet (25 mg ) daily.  Follow-Up: Your physician wants you to follow-up in: 6 MONTHS with Dr. Claiborne Billings. You will receive a reminder letter in the mail two months in advance. If you don't receive a letter, please call our office to schedule the follow-up appointment.   Any Other Special Instructions Will Be Listed Below (If Applicable).     If you need a refill on your cardiac medications before your next appointment, please call your pharmacy.      Signed, Shelva Majestic, MD , [facility] 03/06/2017 6:11 PM    [facility] [address], [place], [place], [place]  [address] Phone: [phone]

## 2017-03-04 NOTE — Patient Instructions (Signed)
Medication Instructions:  START metoprolol succinate (Toprol XL) 12.5 mg (1/2 tablet) daily --if you continue to have palpitations increase to 1 tablet (25 mg ) daily.  Follow-Up: Your physician wants you to follow-up in: 6 MONTHS with Dr. Claiborne Billings. You will receive a reminder letter in the mail two months in advance. If you don't receive a letter, please call our office to schedule the follow-up appointment.   Any Other Special Instructions Will Be Listed Below (If Applicable).     If you need a refill on your cardiac medications before your next appointment, please call your pharmacy.

## 2017-03-05 DIAGNOSIS — E1122 Type 2 diabetes mellitus with diabetic chronic kidney disease: Secondary | ICD-10-CM | POA: Diagnosis not present

## 2017-03-05 DIAGNOSIS — Z0000 Encounter for general adult medical examination without abnormal findings: Secondary | ICD-10-CM | POA: Diagnosis not present

## 2017-03-10 DIAGNOSIS — M8580 Other specified disorders of bone density and structure, unspecified site: Secondary | ICD-10-CM | POA: Diagnosis not present

## 2017-03-10 DIAGNOSIS — R002 Palpitations: Secondary | ICD-10-CM | POA: Diagnosis not present

## 2017-03-10 DIAGNOSIS — Z0001 Encounter for general adult medical examination with abnormal findings: Secondary | ICD-10-CM | POA: Diagnosis not present

## 2017-03-10 DIAGNOSIS — I519 Heart disease, unspecified: Secondary | ICD-10-CM | POA: Diagnosis not present

## 2017-03-10 DIAGNOSIS — F339 Major depressive disorder, recurrent, unspecified: Secondary | ICD-10-CM | POA: Diagnosis not present

## 2017-03-10 DIAGNOSIS — Z1212 Encounter for screening for malignant neoplasm of rectum: Secondary | ICD-10-CM | POA: Diagnosis not present

## 2017-03-10 DIAGNOSIS — F419 Anxiety disorder, unspecified: Secondary | ICD-10-CM | POA: Diagnosis not present

## 2017-03-10 DIAGNOSIS — R740 Nonspecific elevation of levels of transaminase and lactic acid dehydrogenase [LDH]: Secondary | ICD-10-CM | POA: Diagnosis not present

## 2017-03-10 DIAGNOSIS — J449 Chronic obstructive pulmonary disease, unspecified: Secondary | ICD-10-CM | POA: Diagnosis not present

## 2017-03-10 DIAGNOSIS — E1122 Type 2 diabetes mellitus with diabetic chronic kidney disease: Secondary | ICD-10-CM | POA: Diagnosis not present

## 2017-03-10 DIAGNOSIS — N182 Chronic kidney disease, stage 2 (mild): Secondary | ICD-10-CM | POA: Diagnosis not present

## 2017-03-10 DIAGNOSIS — E785 Hyperlipidemia, unspecified: Secondary | ICD-10-CM | POA: Diagnosis not present

## 2017-03-10 DIAGNOSIS — I129 Hypertensive chronic kidney disease with stage 1 through stage 4 chronic kidney disease, or unspecified chronic kidney disease: Secondary | ICD-10-CM | POA: Diagnosis not present

## 2017-04-10 ENCOUNTER — Ambulatory Visit (INDEPENDENT_AMBULATORY_CARE_PROVIDER_SITE_OTHER): Payer: PPO | Admitting: Internal Medicine

## 2017-04-10 ENCOUNTER — Encounter: Payer: Self-pay | Admitting: Internal Medicine

## 2017-04-10 ENCOUNTER — Ambulatory Visit: Payer: PPO | Admitting: Internal Medicine

## 2017-04-10 VITALS — BP 94/60 | HR 75 | Ht 67.0 in | Wt 146.0 lb

## 2017-04-10 DIAGNOSIS — J449 Chronic obstructive pulmonary disease, unspecified: Secondary | ICD-10-CM

## 2017-04-10 LAB — PULMONARY FUNCTION TEST
DL/VA % PRED: 67 %
DL/VA: 3.46 ml/min/mmHg/L
DLCO UNC % PRED: 51 %
DLCO cor % pred: 54 %
DLCO cor: 15.4 ml/min/mmHg
DLCO unc: 14.64 ml/min/mmHg
FEF 25-75 Post: 0.97 L/sec
FEF 25-75 Pre: 0.64 L/sec
FEF2575-%CHANGE-POST: 51 %
FEF2575-%PRED-POST: 47 %
FEF2575-%Pred-Pre: 31 %
FEV1-%CHANGE-POST: 15 %
FEV1-%Pred-Post: 68 %
FEV1-%Pred-Pre: 59 %
FEV1-PRE: 1.51 L
FEV1-Post: 1.74 L
FEV1FVC-%CHANGE-POST: 11 %
FEV1FVC-%Pred-Pre: 73 %
FEV6-%Change-Post: 3 %
FEV6-%PRED-PRE: 81 %
FEV6-%Pred-Post: 84 %
FEV6-PRE: 2.6 L
FEV6-Post: 2.7 L
FEV6FVC-%Change-Post: 0 %
FEV6FVC-%PRED-PRE: 102 %
FEV6FVC-%Pred-Post: 101 %
FVC-%CHANGE-POST: 3 %
FVC-%PRED-POST: 83 %
FVC-%PRED-PRE: 80 %
FVC-POST: 2.79 L
FVC-Pre: 2.69 L
POST FEV1/FVC RATIO: 62 %
POST FEV6/FVC RATIO: 97 %
PRE FEV6/FVC RATIO: 98 %
Pre FEV1/FVC ratio: 56 %
RV % PRED: 127 %
RV: 3.01 L
TLC % pred: 105 %
TLC: 5.78 L

## 2017-04-10 MED ORDER — BUDESONIDE-FORMOTEROL FUMARATE 80-4.5 MCG/ACT IN AERO: 2.0000 | INHALATION_SPRAY | RESPIRATORY_TRACT | 11 refills | Status: AC

## 2017-04-10 MED ORDER — ALBUTEROL SULFATE HFA 108 (90 BASE) MCG/ACT IN AERS: 2.0000 | INHALATION_SPRAY | Freq: Four times a day (QID) | RESPIRATORY_TRACT | 11 refills | Status: AC | PRN

## 2017-04-10 NOTE — Progress Notes (Signed)
PFT done today. 

## 2017-04-10 NOTE — Progress Notes (Signed)
Subjective:     Patient ID: Lorraine Miller, female   DOB: 1946-04-22,   MRN: [id number]     Brief patient profile:  77 yowf quit smoking 2003 referred to pulmonary clinic 10/24/2016 by Dr   Shelia Media for copd eval  With GOLD II criteria 04/10/2017    History of Present Illness  10/24/2016 1st [facility] office visit/ Mahogani Holohan   Chief Complaint  Patient presents with  . Pulmonary Consult    Referred by Dr. Deland Pretty for eval of COPD. Pt states she was dxed with COPD in 2015 and has been on inhalers since then. She states that she only has trouble breathing when carrying something heavy up a flight of stairs. She very rarely has to use albuterol.    new doe 2015 > eval by Jens Som in [place] > pfts by [facility] > GOLD II copd rx spiriva dpi/ symbicort  Doe = MMRC1 = can walk nl pace, flat grade, can't hurry or go uphills or steps s sob   rec You have GOLD II copd severity  = moderate  Treatment is continue to breathe clean air and treat your symptoms  Stop spiriva and continue symbicort 80 Take 2 puffs first thing in am and then another 2 puffs about 12 hours later only if you feel you need it  Work on inhaler technique:    04/10/2017  f/u ov/Shakiya Mcneary re:  GOLD II copd/ maint on symb 80 2 qam / no need for saba since changed maint rx  Chief Complaint  Patient presents with  . Follow-up    PFT's done today. Her breathing is doing well and she denies any new co's today. She rarely uses her albuterol.      No change doe = MMRC 1 on just the symb 80 2 each am  No noct symtpoms   No obvious day to day or daytime variability or assoc excess/ purulent sputum or mucus plugs or hemoptysis or cp or chest tightness, subjective wheeze or overt sinus or hb symptoms. No unusual exp hx or h/o childhood pna/ asthma or knowledge of premature birth.  Sleeping ok flat without nocturnal  or early am exacerbation  of respiratory  c/o's or need for noct saba. Also denies any obvious fluctuation of symptoms  with weather or environmental changes or other aggravating or alleviating factors except as outlined above   Current Allergies, Complete Past Medical History, Past Surgical History, Family History, and Social History were reviewed in [facility] record.  ROS  The following are not active complaints unless bolded Hoarseness, sore throat, dysphagia, dental problems, itching, sneezing,  nasal congestion or discharge of excess mucus or purulent secretions, ear ache,   fever, chills, sweats, unintended wt loss or wt gain, classically pleuritic or exertional cp,  orthopnea pnd or leg swelling, presyncope, palpitations, abdominal pain, anorexia, nausea, vomiting, diarrhea  or change in bowel habits or change in bladder habits, change in stools or change in urine, dysuria, hematuria,  rash, arthralgias, visual complaints, headache, numbness, weakness or ataxia or problems with walking or coordination,  change in mood/affect or memory.        Current Meds  Medication Sig  . budesonide-formoterol (SYMBICORT) 80-4.5 MCG/ACT inhaler Inhale 2 puffs as directed into the lungs.  . Cholecalciferol (VITAMIN D) 2000 UNITS tablet Take 2,000 Units by mouth daily.  . furosemide (LASIX) 40 MG tablet Take 1 tablet (40 mg total) by mouth daily as needed for edema.  Marland Kitchen losartan (  COZAAR) 25 MG tablet Take 1 tablet (25 mg total) by mouth daily.  . Magnesium 400 MG CAPS Take 400 mg by mouth.  . metFORMIN (GLUCOPHAGE) 500 MG tablet Take 1 tablet (500 mg total) by mouth 2 (two) times daily with a meal.  . metoprolol succinate (TOPROL XL) 25 MG 24 hr tablet Take 0.5 tablets (12.5 mg total) by mouth daily.  . potassium chloride (K-DUR) 10 MEQ tablet Take 1 tablet (10 mEq total) by mouth daily. (Patient taking differently: Take 10 mEq by mouth daily. Only when she takes lasix)  . rosuvastatin (CRESTOR) 10 MG tablet Take 10 mg by mouth daily.  .                Objective:   Physical Exam   amb wm nad    04/10/2017        146    10/24/16 149 lb 6.4 oz (67.8 kg)  08/14/16 153 lb (69.4 kg)  07/02/16 154 lb (69.9 kg)    Vital signs reviewed - Note on arrival 02 sats  97% on RA      HEENT: nl dentition, turbinates bilaterally, and oropharynx. Nl external ear canals without cough reflex   NECK :  without JVD/Nodes/TM/ nl carotid upstrokes bilaterally   LUNGS: no acc muscle use,  Nl contour chest which is clear to A and P bilaterally without cough on insp or exp maneuvers   CV:  RRR  no s3 or murmur or increase in P2, and no edema   ABD:  soft and nontender with nl inspiratory excursion in the supine position. No bruits or organomegaly appreciated, bowel sounds nl  MS:  Nl gait/ ext warm without deformities, calf tenderness, cyanosis or clubbing No obvious joint restrictions   SKIN: warm and dry without lesions    NEURO:  alert, approp, nl sensorium with  no motor or cerebellar deficits apparent.              Assessment:

## 2017-04-10 NOTE — Assessment & Plan Note (Signed)
Quit smoking 2003 - PFT's  [facility]:  04/14/14   FEV1 1.43(60 %) ratio 40  p 11 % improvement from saba p ? prior to study with DLCO  74 % corrects to 88  % for alv volume     - 10/24/2016    rec d/c spiriva and change symb 80 to just 2 each am  - PFT's  04/10/2017  FEV1 1.74  (68 % ) ratio 62  p 15 2% improvement from saba p sym 80 2  prior to study with DLCO  51/54  % corrects to 67 % for alv volume  - 04/10/2017  After extensive coaching HFA effectiveness =    90% > continue symb 80 2 each am and pm dose prn   Lung function is actually better now on less medication than 3 years ago so no need to change rx - can always use the 80 strength 2bid if having more symptoms or needing rescue rx more than a few times a week   Each maintenance medication was reviewed in detail including most importantly the difference between maintenance and as needed and under what circumstances the prns are to be used.  Please see AVS for specific  Instructions which are unique to this visit and I personally typed out  which were reviewed in detail in writing with the patient and a copy provided.    Pulmonary f/u is prn

## 2017-04-10 NOTE — Patient Instructions (Addendum)
Symbicort 80 2 pffs every am and if any problems breathing/ coughing then take the second 2 pffs 12 hours later   Work on inhaler technique:  relax and gently blow all the way out then take a nice smooth deep breath back in, triggering the inhaler at same time you start breathing in.  Hold for up to 5 seconds if you can. Blow out thru nose. Rinse and gargle with water when done   Only use your albuterol as a rescue medication to be used if you can't catch your breath by resting or doing a relaxed purse lip breathing pattern.  - The less you use it, the better it will work when you need it. - Ok to use up to 2 puffs  every 4 hours if you must but call for immediate appointment if use goes up over your usual need - Don't leave home without it !!  (think of it like the spare tire for your car)    If you are satisfied with your treatment plan,  let your doctor know and he/she can either refill your medications or you can return here when your prescription runs out.     If in any way you are not 100% satisfied,  please tell us.  If 100% better, tell your friends!  Pulmonary follow up is as needed

## 2017-06-10 DIAGNOSIS — M79605 Pain in left leg: Secondary | ICD-10-CM | POA: Diagnosis not present

## 2017-06-10 DIAGNOSIS — M79622 Pain in left upper arm: Secondary | ICD-10-CM | POA: Diagnosis not present

## 2017-06-10 DIAGNOSIS — R232 Flushing: Secondary | ICD-10-CM | POA: Diagnosis not present

## 2017-06-13 ENCOUNTER — Ambulatory Visit: Payer: PPO | Admitting: Sports Medicine

## 2017-06-13 ENCOUNTER — Encounter: Payer: Self-pay | Admitting: Sports Medicine

## 2017-06-13 VITALS — BP 112/72 | Ht 66.0 in | Wt 148.0 lb

## 2017-06-13 DIAGNOSIS — M79652 Pain in left thigh: Secondary | ICD-10-CM

## 2017-06-13 NOTE — Progress Notes (Signed)
Chief complaint: Left lateral thigh pain 2 months  History of present illness: Lorraine Miller is a 72 year old female who presents to the sports medicine office today with chief complaint of point specific left lateral thigh pain. She reports that symptoms started about 2 months ago. She does not report any specific inciting incident, trauma, or injury to explain the pain. She reports that she just woke up suddenly one night with sudden onset of throbbing pain in her left lateral thigh, specifically down the proximal third of the thigh. She reports feeling that the pain is deep inside the leg, specifically in the bone. She describes the pain as a throbbing, aching pain. She does not report of any radiation of pain. She reports noticing this when she stands for prolonged periods of time or walks. She does not report of pain getting worse over the last few months. She reports that she has tried occasional Aleve, with no improvement in her symptoms. She does not report of any left hip pain or left knee pain. She does not report of any warmth, erythema, ecchymosis, or effusion. She does not report of any fevers, chills, night sweats, or any unintentional weight loss. She reports that she was diagnosed with breast cancer back in 2012, reports that it was localized, did not have to undergo any type of chemotherapy or radiation therapy. She did have double mastectomy. She reports that she is planning to have her next colonoscopy next year. She has never had any issues with Pap screening. She reports that she initially went to her primary physician for this, who ordered x-rays of her hip, femur, knee. She does bring the disc of the images with her to the office today.  Review of systems:  As stated above  Her past medical history, surgical history, family history, and social history obtained and reviewed. Past medical history notable for COPD, history of breast cancer, cervical radiculopathy, and anxiety. She does not  report of any current tobacco use. Surgical history notable for tonsillectomy, parathyroidectomy, double mastectomy, appendectomy, anterior cervical discectomy, and abdominal hysterectomy,  Family history notable for diabetes, heart disease, multiple myeloma, and asthma  Physical exam: Vital signs are reviewed and are documented in the chart Gen.: Alert, oriented, appears stated age, in no apparent distress HEENT: Moist oral mucosa Respiratory: Normal respirations, able to speak in full sentences Cardiac: Regular rate, distal pulses 2+ Integumentary: No rashes on visible skin:  Neurologic: She does have slight weakness with hip abduction and hamstring strength on the left side, good quadriceps strength on the left side, she also has slight weakness with hip abduction and hamstring strength on the right side, sensation 2+ in bilateral lower extremities  Psych: Normal affect, mood is described as good Musculoskeletal: Inspection of left thigh reveals no obvious deformity or muscle atrophy, no warmth, erythema, ecchymosis, or effusion, she does have pinpoint tenderness to palpation down the proximal third of the thigh, about 4 cm inferior to the greater trochanteric bursa, no tenderness to palpation over the left anterior, lateral, and posterior hip, no tenderness over the left greater trochanter bursa, no pain over the anterior thigh and posterior thigh,straight leg negative, FABER, FADIR, and log roll negative, she has normal hip external and internal range of motion  Assessment and plan: 1. 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 History of breast cancer diagnosed in 2012, status post double mastectomy  Mainly, she is here today for evaluation to ensure that her pain  is not from any type of metastatic bone lesion. Did review x-rays that were ordered by her primary physician. Fortunately, her x-rays do not show any type of acute bony  abnormality, femur is overall unremarkable, no evidence of any type of metastatic bone lesion, she has normal joint space in her left hip and knee. Ultimately, discussed that this is all most likely muscle related pain. Discussed physical therapy exercises for her to do at home, specifically focusing on hip abductor, quadriceps, and hamstring strengthening exercises. Discussed option of formal therapy later on down the road if she would like to pursue this. Discussed topical Aspercreme for pain. Otherwise, we'll keep things open ended and have her follow-up on as-needed basis.  Mort Sawyers, M.D. [place]

## 2017-07-31 ENCOUNTER — Other Ambulatory Visit: Payer: Self-pay | Admitting: Cardiovascular Disease

## 2017-08-14 ENCOUNTER — Encounter: Payer: Self-pay | Admitting: Cardiovascular Disease

## 2017-08-14 ENCOUNTER — Ambulatory Visit (INDEPENDENT_AMBULATORY_CARE_PROVIDER_SITE_OTHER): Payer: PPO | Admitting: Cardiovascular Disease

## 2017-08-14 VITALS — BP 122/70 | HR 68 | Ht 66.0 in | Wt 151.0 lb

## 2017-08-14 DIAGNOSIS — M19042 Primary osteoarthritis, left hand: Secondary | ICD-10-CM | POA: Diagnosis not present

## 2017-08-14 DIAGNOSIS — E109 Type 1 diabetes mellitus without complications: Secondary | ICD-10-CM

## 2017-08-14 DIAGNOSIS — I739 Peripheral vascular disease, unspecified: Secondary | ICD-10-CM

## 2017-08-14 DIAGNOSIS — R002 Palpitations: Secondary | ICD-10-CM

## 2017-08-14 DIAGNOSIS — I779 Disorder of arteries and arterioles, unspecified: Secondary | ICD-10-CM

## 2017-08-14 DIAGNOSIS — I10 Essential (primary) hypertension: Secondary | ICD-10-CM

## 2017-08-14 DIAGNOSIS — M19041 Primary osteoarthritis, right hand: Secondary | ICD-10-CM | POA: Diagnosis not present

## 2017-08-14 NOTE — Patient Instructions (Signed)

## 2017-08-14 NOTE — Progress Notes (Signed)
Cardiology Office Note    Date:  08/14/2017   ID:  JAKYRAH HOLLADAY, DOB 1945/09/20, MRN [id number]  PCP:  Deland Pretty, MD  Cardiologist:  Shelva Majestic, MD   Chief Complaint  Patient presents with  . Follow-up    History of Present Illness:  Lorraine Miller is a 72 y.o. female who established cardiology care with me in January 2018. I last saw her in October 2018.  She presents for a 5 month follow-upcardiology evaluation  Lorraine Miller has a history of diabetes mellitus for 14 years, COPD, diabetes mellitus, hyperlipidemia, as well as depression.  Approximately 8 years ago, she had undergone a stress test by Dr. Kerrin Mo at the [facility] in [place] which was abnormal and ultimately had a heart catheterization by Dr.[name]..  She was told that her coronaries were normal at that time.  She has a history of breast CA in situ and underwent double mastectomy with subsequent breast reconstructive surgery.  On 05/01/2016 was she was cutting her husband's hair.  She became very lightheaded and presyncopal.  Reportedly her husband had taken her blood pressure which was very low.  By the time EMS arrived, the patient states EMS blood pressure recording was 90/55.  She presented to [facility] hospital and on presentation her blood pressure had risen to 115/52.  CT scan did not reveal any acute abnormality.  She had mild left frontal sinus disease.  A chest x-ray showed COPD with mild bibasal atelectasis glass scarring.  Her ECG was unremarkable.  Troponins were negative.  She again had another short-lived episode of presyncope on New Year's Day, which ultimately resolved.  She is uncertain if she was dehydrated on both of these occasions.  She denied associated chest tightness.  She saw me initially in January 2018 after she had seen on the [facility] record that she may have  mild renal failure and heart failure.  The patient states she was never told this and presented for cardiologic  evaluation.  I reassured her after reviewing the [facility] record laboratory that she did not have renal failure, but actually had stage II renal staging.  I scheduled her for 2-D echo Doppler study which was done 03/15/2017 and showed an EF of 55-60%.  There was mild grade 1 diastolic dysfunction with aortic valve sclerosis.  She underwent follow-up laboratory on February 1 18, which showed a creatinine of 0.83.  Lipid studies were notable for total cholesterol 161, HDL 47, LDL 85, and triglycerides 147.    I recommended that she discontinue lovastatin and changed rosuvastatin 10 mg for more aggressive lipid-lowering.  Subsequent laboratory in October 2018 showed improvement in LDL cholesterol down to 54.  Since I last saw her, she has continued to feel well from a heart standpoint.  Her blood pressure has always been normal.  She has had issues with left leg discomfort.  She apparently saw the sports medicine people.  She has had issues remotely of cervical neck disease but does not recall being evaluated for lumbar sacral disease.  She also at times notes that her fingers get cold.  There is remote tobacco history but she quit smoking in 2003.  He does not require being cold temperatures for this sensation.  She presents for follow-up evaluation  Past Medical History:  Diagnosis Date  . Arthritis   . Cancer [facility])    Breast cancer  . Colon polyp   . COPD (chronic obstructive pulmonary disease) ([place])   .  Depression   . Diabetes mellitus without complication [facility])     Past Surgical History:  Procedure Laterality Date  . ABDOMINAL HYSTERECTOMY    . ANTERIOR CERVICAL DISCECTOMY    . APPENDECTOMY    . BREAST LUMPECTOMY    . double mastectomy    . PARATHYROIDECTOMY    . POSTERIOR CERVICAL FUSION/FORAMINOTOMY    . surgical breast biopsy    . TONSILLECTOMY      Current Medications: Outpatient Medications Prior to Visit  Medication Sig Dispense Refill  . albuterol (PROVENTIL HFA)  108 (90 Base) MCG/ACT inhaler Inhale 2 puffs every 6 (six) hours as needed into the lungs for wheezing or shortness of breath. 1 Inhaler 11  . budesonide-formoterol (SYMBICORT) 80-4.5 MCG/ACT inhaler Inhale 2 puffs as directed into the lungs. 1 Inhaler 11  . Cholecalciferol (VITAMIN D) 2000 UNITS tablet Take 2,000 Units by mouth daily.    . furosemide (LASIX) 40 MG tablet Take 1 tablet (40 mg total) by mouth daily as needed for edema. 30 tablet 2  . losartan (COZAAR) 25 MG tablet Take 1 tablet (25 mg total) by mouth daily. 90 tablet 0  . Magnesium 400 MG CAPS Take 400 mg by mouth.    . metFORMIN (GLUCOPHAGE) 500 MG tablet Take 1 tablet (500 mg total) by mouth 2 (two) times daily with a meal. 60 tablet 2  . metoprolol succinate (TOPROL XL) 25 MG 24 hr tablet Take 0.5 tablets (12.5 mg total) by mouth daily. 45 tablet 3  . potassium chloride (K-DUR) 10 MEQ tablet Take 1 tablet (10 mEq total) by mouth daily. (Patient taking differently: Take 10 mEq by mouth daily. Only when she takes lasix) 30 tablet 2  . rosuvastatin (CRESTOR) 10 MG tablet TAKE ONE TABLET BY MOUTH DAILY 90 tablet 2   No facility-administered medications prior to visit.      Allergies:   Abilify [aripiprazole]; Ambien [zolpidem tartrate]; and Gabapentin   Social History   Socioeconomic History  . Marital status: Married    Spouse name: None  . Number of children: 2  . Years of education: 48  . Highest education level: None  Social Needs  . Financial resource strain: None  . Food insecurity - worry: None  . Food insecurity - inability: None  . Transportation needs - medical: None  . Transportation needs - non-medical: None  Occupational History  . None  Tobacco Use  . Smoking status: Former Smoker    Packs/day: 0.25    Years: 28.00    Pack years: 7.00    Types: Cigarettes    Last attempt to quit: 06/03/2001    Years since quitting: 16.2  . Smokeless tobacco: Never Used  Substance and Sexual Activity  . Alcohol use:  No    Alcohol/week: 0.0 oz  . Drug use: No  . Sexual activity: None  Other Topics Concern  . None  Social History Narrative   Fun: Scientist, research (life sciences) and crafts   Denies religious beliefs effecting health care.   Denies abuse and feels safe at home.      Family History:  The patient's family history includes Asthma in her daughter, maternal grandfather, mother, and sister; Diabetes in her father and paternal grandfather; Healthy in her maternal grandfather, maternal grandmother, and paternal grandfather; Heart disease in her father; Multiple myeloma in her mother; Stomach cancer in her paternal grandmother.   ROS General: Negative; No fevers, chills, or night sweats;  HEENT: Negative; No changes in vision or hearing, sinus congestion,  difficulty swallowing Pulmonary: Negative; No cough, wheezing, shortness of breath, hemoptysis Cardiovascular: See history of present illness GI: Negative; No nausea, vomiting, diarrhea, or abdominal pain GU: Negative; No dysuria, hematuria, or difficulty voiding Musculoskeletal: Negative; no myalgias, joint pain, or weakness Hematologic/Oncology: Negative; no easy bruising, bleeding Endocrine: Negative; no heat/cold intolerance; no diabetes Neuro: Negative; no changes in balance, headaches Skin: Negative; No rashes or skin lesions Psychiatric: Negative; No behavioral problems, depression Sleep: Negative; No snoring, daytime sleepiness, hypersomnolence, bruxism, restless legs, hypnogognic hallucinations, no cataplexy Other comprehensive 14 point system review is negative.   PHYSICAL EXAM:   VS:  BP 122/70   Pulse 68   Ht '5\' 6"'  (1.676 m)   Wt 151 lb (68.5 kg)   BMI 24.37 kg/m     Repeat blood pressure by me was 116/70  Wt Readings from Last 3 Encounters:  08/14/17 151 lb (68.5 kg)  06/13/17 148 lb (67.1 kg)  04/10/17 146 lb (66.2 kg)    General: Alert, oriented, no distress.  Skin: normal turgor, no rashes, warm and dry HEENT: Normocephalic,  atraumatic. Pupils equal round and reactive to light; sclera anicteric; extraocular muscles intact;  Nose without nasal septal hypertrophy Mouth/Parynx benign; Mallinpatti scale 3 Neck: No JVD, no carotid bruits; normal carotid upstroke Lungs: clear to ausculatation and percussion; no wheezing or rales Chest wall: without tenderness to palpitation Heart: PMI not displaced, RRR, s1 s2 normal, 1/6 systolic murmur, no diastolic murmur, no rubs, gallops, thrills, or heaves Abdomen: soft, nontender; no hepatosplenomehaly, BS+; abdominal aorta nontender and not dilated by palpation. Back: no CVA tenderness Pulses 2+ Musculoskeletal: full range of motion, normal strength, no joint deformities Extremities: osteoarthritis with Heberden's nodes; no clubbing cyanosis or edema, Homan's sign negative  Neurologic: grossly nonfocal; Cranial nerves grossly wnl Psychologic: Normal mood and affect   Studies/Labs Reviewed:   ECG (independently read by me): sinus rhythm at 66 bpm.  No ectopy.  Normal intervals.  No ST segment changes.  October 2018ECG (independently read by me): normal sinus rhythm at 80 bpm.  Normal intervals.  No ectopy.  March 2018 ECG (independently read by me): Normal sinus rhythm at 81 bpm.  Normal intervals.  No ectopy.  No ST segment changes.  January 2018 EKG:  EKG is ordered today.  ECG (independently read by me): Normal sinus rhythm at 77 bpm.  No significant ST-T changes.  PR interval 114 ms; QRS duration 84 ms; QTc interval normal at 411 milliseconds.  Recent Labs: BMP Latest Ref Rng & Units 11/14/2016 07/04/2016 05/01/2016  Glucose 65 - 99 mg/dL 116(H) 117(H) 110(H)  BUN 7 - 25 mg/dL '18 19 10  ' Creatinine 0.60 - 0.93 mg/dL 0.84 0.83 0.77  Sodium 135 - 146 mmol/L 139 140 141  Potassium 3.5 - 5.3 mmol/L 4.6 4.3 3.8  Chloride 98 - 110 mmol/L 103 104 105  CO2 20 - 31 mmol/L '27 28 27  ' Calcium 8.6 - 10.4 mg/dL 9.3 8.9 9.4     Hepatic Function Latest Ref Rng & Units 12/16/2016  11/14/2016 07/04/2016  Total Protein 6.0 - 8.5 g/dL 6.2 6.2 6.3  Albumin 3.5 - 4.8 g/dL 4.7 4.3 4.1  AST 0 - 40 IU/L '25 28 18  ' ALT 0 - 32 IU/L 30 42(H) 24  Alk Phosphatase 39 - 117 IU/L 61 60 55  Total Bilirubin 0.0 - 1.2 mg/dL 0.9 1.2 1.6(H)  Bilirubin, Direct 0.00 - 0.40 mg/dL 0.24 - -    CBC Latest Ref Rng & Units 07/04/2016 05/01/2016  12/20/2013  WBC 3.8 - 10.8 K/uL 6.1 10.5 8.7  Hemoglobin 11.7 - 15.5 g/dL 13.9 14.1 13.9  Hematocrit 35.0 - 45.0 % 40.7 41.5 42.3  Platelets 140 - 400 K/uL 343 330 320   Lab Results  Component Value Date   MCV 87.5 07/04/2016   MCV 87.7 05/01/2016   MCV 91 12/20/2013   Lab Results  Component Value Date   TSH 3.35 07/04/2016   Lab Results  Component Value Date   HGBA1C 6.0 (H) 07/04/2016     BNP No results found for: BNP  ProBNP No results found for: PROBNP   Lipid Panel     Component Value Date/Time   CHOL 129 11/14/2016 0825   TRIG 77 11/14/2016 0825   HDL 58 11/14/2016 0825   CHOLHDL 2.2 11/14/2016 0825   VLDL 15 11/14/2016 0825   LDLCALC 56 11/14/2016 0825     RADIOLOGY: No results found.   Additional studies/ records that were reviewed today include:  I reviewed the emergency room records as well as records from [facility], recent labs and echo.     ASSESSMENT:    1. Essential hypertension   2. Palpitations   3. Type 2 diabetes mellitus without complication ([place])   4. Mild carotid artery disease ([place])   5. Osteoarthritis of both hands, unspecified osteoarthritis type     PLAN:  Lorraine Miller is a 72 year old female who has a history of , type 2 diabetes mellitus, hypertension, hyperlipidemia, and COPD.   A nuclear stress test done 9years ago raised concern for ischemia and definitive cardiac catheterization done in [place] revealed normal coronary arteries.  She had experienced 2 episodes of presyncope while standing.  When I initially saw her, she was not orthostatic.  I reduced her  diuretic regimen.  She does not have renal failure and does not have signs of CHF.  Her echo Doppler study of February 2018 revealed normal systolic function although there was evidence for mild grade 1 diastolic dysfunction. .  Carotid studies revealed mild plaque bilaterally, less than 39%. Her blood pressure today is excellent.  She is not having any palpitations on Toprol-XL 12.5 mg daily.  She has a prescription for furosemide which he takes as needed.  She is on losartan 25 mg.  Although she notes her fingers get cold, this is not always in cold weather, arguing against 2.  Renal lites like phenomenon.  If this continues to be a problem, a trial of very low-dose amlodipine may be indicated, but I will not do this presently.  She continues to do well on rosuvastatin for hyperlipidemia and LDL is improved at 54 .  In this diabetic female.  She continues to be on metformin for diabetes mellitus.  Hemoglobin A1c in October 2018 was 6.5.  She has COPD and is on Symbicort and when necessary albuterol. .  She has osteoarthritis I recommended she follow-up with her primary doctor for consideration of possible evaluation of lumbar disc disease.  From a cardiac standpoint she is stable, I will see her in one year for reevaluation.  Medication Adjustments/Labs and Tests Ordered: Current medicines are reviewed at length with the patient today.  Concerns regarding medicines are outlined above.  Medication changes, Labs and Tests ordered today are listed in the Patient Instructions below. Patient Instructions  Medication Instructions:  Your physician recommends that you continue on your current medications as directed. Please refer to the Current Medication list given to you today.  Follow-Up:  Your physician wants you to follow-up in: 12 months with Dr. Dow Adolph will receive a reminder letter in the mail two months in advance. If you don't receive a letter, please call our office to schedule the follow-up  appointment.   Any Other Special Instructions Will Be Listed Below (If Applicable).     If you need a refill on your cardiac medications before your next appointment, please call your pharmacy.      Signed, Shelva Majestic, MD , [facility] 08/14/2017 8:24 AM    [facility] [address] [place], [place], [place]  [address] Phone: [phone]

## 2017-09-15 DIAGNOSIS — J449 Chronic obstructive pulmonary disease, unspecified: Secondary | ICD-10-CM | POA: Diagnosis not present

## 2017-09-15 DIAGNOSIS — F17211 Nicotine dependence, cigarettes, in remission: Secondary | ICD-10-CM | POA: Diagnosis not present

## 2017-09-15 DIAGNOSIS — I519 Heart disease, unspecified: Secondary | ICD-10-CM | POA: Diagnosis not present

## 2017-09-15 DIAGNOSIS — I129 Hypertensive chronic kidney disease with stage 1 through stage 4 chronic kidney disease, or unspecified chronic kidney disease: Secondary | ICD-10-CM | POA: Diagnosis not present

## 2017-09-15 DIAGNOSIS — J441 Chronic obstructive pulmonary disease with (acute) exacerbation: Secondary | ICD-10-CM | POA: Diagnosis not present

## 2017-11-10 DIAGNOSIS — H2512 Age-related nuclear cataract, left eye: Secondary | ICD-10-CM | POA: Diagnosis not present

## 2018-02-03 ENCOUNTER — Other Ambulatory Visit: Payer: Self-pay | Admitting: Cardiovascular Disease

## 2018-03-09 DIAGNOSIS — E785 Hyperlipidemia, unspecified: Secondary | ICD-10-CM | POA: Diagnosis not present

## 2018-03-09 DIAGNOSIS — I129 Hypertensive chronic kidney disease with stage 1 through stage 4 chronic kidney disease, or unspecified chronic kidney disease: Secondary | ICD-10-CM | POA: Diagnosis not present

## 2018-03-09 DIAGNOSIS — E1122 Type 2 diabetes mellitus with diabetic chronic kidney disease: Secondary | ICD-10-CM | POA: Diagnosis not present

## 2018-03-09 DIAGNOSIS — N390 Urinary tract infection, site not specified: Secondary | ICD-10-CM | POA: Diagnosis not present

## 2018-03-12 DIAGNOSIS — F419 Anxiety disorder, unspecified: Secondary | ICD-10-CM | POA: Diagnosis not present

## 2018-03-12 DIAGNOSIS — I129 Hypertensive chronic kidney disease with stage 1 through stage 4 chronic kidney disease, or unspecified chronic kidney disease: Secondary | ICD-10-CM | POA: Diagnosis not present

## 2018-03-12 DIAGNOSIS — Z0000 Encounter for general adult medical examination without abnormal findings: Secondary | ICD-10-CM | POA: Diagnosis not present

## 2018-03-12 DIAGNOSIS — E1122 Type 2 diabetes mellitus with diabetic chronic kidney disease: Secondary | ICD-10-CM | POA: Diagnosis not present

## 2018-03-12 DIAGNOSIS — I6523 Occlusion and stenosis of bilateral carotid arteries: Secondary | ICD-10-CM | POA: Diagnosis not present

## 2018-03-12 DIAGNOSIS — N182 Chronic kidney disease, stage 2 (mild): Secondary | ICD-10-CM | POA: Diagnosis not present

## 2018-03-12 DIAGNOSIS — F339 Major depressive disorder, recurrent, unspecified: Secondary | ICD-10-CM | POA: Diagnosis not present

## 2018-03-12 DIAGNOSIS — J441 Chronic obstructive pulmonary disease with (acute) exacerbation: Secondary | ICD-10-CM | POA: Diagnosis not present

## 2018-03-12 DIAGNOSIS — I519 Heart disease, unspecified: Secondary | ICD-10-CM | POA: Diagnosis not present

## 2018-03-12 DIAGNOSIS — M8580 Other specified disorders of bone density and structure, unspecified site: Secondary | ICD-10-CM | POA: Diagnosis not present

## 2018-03-12 DIAGNOSIS — J449 Chronic obstructive pulmonary disease, unspecified: Secondary | ICD-10-CM | POA: Diagnosis not present

## 2018-03-12 DIAGNOSIS — E785 Hyperlipidemia, unspecified: Secondary | ICD-10-CM | POA: Diagnosis not present

## 2018-04-13 DIAGNOSIS — Z13820 Encounter for screening for osteoporosis: Secondary | ICD-10-CM | POA: Diagnosis not present

## 2018-04-15 DIAGNOSIS — Z01419 Encounter for gynecological examination (general) (routine) without abnormal findings: Secondary | ICD-10-CM | POA: Diagnosis not present

## 2018-04-15 DIAGNOSIS — F329 Major depressive disorder, single episode, unspecified: Secondary | ICD-10-CM | POA: Diagnosis not present

## 2018-04-15 DIAGNOSIS — Z1212 Encounter for screening for malignant neoplasm of rectum: Secondary | ICD-10-CM | POA: Diagnosis not present

## 2018-04-29 DIAGNOSIS — F339 Major depressive disorder, recurrent, unspecified: Secondary | ICD-10-CM | POA: Diagnosis not present

## 2018-05-25 ENCOUNTER — Other Ambulatory Visit: Payer: Self-pay | Admitting: Internal Medicine

## 2018-08-18 ENCOUNTER — Telehealth: Payer: Self-pay | Admitting: Cardiovascular Disease

## 2018-08-18 NOTE — Telephone Encounter (Signed)
Spoke to wife she was calling  For her and husband. She states neither of them have symptoms - but patient  had Recent echo  need results-- 08/20/18.  wife states she will keep both appointment and if need to cancel her appointment next wil do so

## 2018-08-18 NOTE — Telephone Encounter (Signed)
New Message:    Patient calling concerning her appt on 08/26/18 with Dr. Claiborne Billings and would like to know would he see her and her husband on that day. Patient husband has a appt this week. Please call patient.

## 2018-08-20 ENCOUNTER — Telehealth: Payer: Self-pay

## 2018-08-20 NOTE — Telephone Encounter (Signed)
   Cardiac Questionnaire:    Since your last visit or hospitalization:    1. Have you been having new or worsening chest pain? NO   2. Have you been having new or worsening shortness of breath? NO 3. Have you been having new or worsening leg swelling, wt gain, or increase in abdominal girth (pants fitting more tightly)? NO   4. Have you had any passing out spells? NO    *A YES to any of these questions would result in the appointment being kept. *If all the answers to these questions are NO, we should indicate that given the current situation regarding the worldwide coronarvirus pandemic, at the recommendation of the CDC, we are looking to limit gatherings in our waiting area, and thus will reschedule their appointment beyond four weeks from today.   _____________   NIOEV-03 Pre-Screening Questions:  . Do you currently have a fever? NO (yes = cancel and refer to pcp for e-visit) . Have you recently travelled on a cruise, internationally, or to [place], [place], [place], [place], [place], or [place], [place] [employer]) ? NO (yes = cancel, stay home, monitor symptoms, and contact pcp or initiate e-visit if symptoms develop) . Have you been in contact with someone that is currently pending confirmation of Covid19 testing or has been confirmed to have the [place] virus?  NO (yes = cancel, stay home, away from tested individual, monitor symptoms, and contact pcp or initiate e-visit if symptoms develop) . Are you currently experiencing fatigue or cough? NO (yes = pt should be prepared to have a mask placed at the time of their visit).      Patient agreement to move appointment- this was a year appointment for medication refill. Recommend to follow back up with her in 6 months.

## 2018-08-20 NOTE — Telephone Encounter (Signed)
Patient appointment was cancelled today after speaking with her at her husbands appointment.  Patient information was sent to the Covid 19 cancel pool.

## 2018-08-26 ENCOUNTER — Ambulatory Visit: Payer: PPO | Admitting: Cardiovascular Disease

## 2018-08-31 ENCOUNTER — Other Ambulatory Visit: Payer: Self-pay

## 2018-08-31 MED ORDER — METOPROLOL SUCCINATE ER 25 MG PO TB24: 12.5000 mg | ORAL_TABLET | Freq: Every day | ORAL | 1 refills | Status: DC

## 2018-08-31 NOTE — Telephone Encounter (Signed)
Called patient, advised if she would like to do virtual visits (Telephone/video) patient declined at this time, due to not having problems and she will wait until they call for her appointment.

## 2018-09-14 DIAGNOSIS — J449 Chronic obstructive pulmonary disease, unspecified: Secondary | ICD-10-CM | POA: Diagnosis not present

## 2018-09-14 DIAGNOSIS — E1122 Type 2 diabetes mellitus with diabetic chronic kidney disease: Secondary | ICD-10-CM | POA: Diagnosis not present

## 2018-11-20 ENCOUNTER — Telehealth: Payer: Self-pay | Admitting: Physician Assistant

## 2018-11-23 NOTE — Telephone Encounter (Signed)
smartphone/ consent/ my chart active/ pre reg completed °

## 2018-11-25 NOTE — Telephone Encounter (Signed)
I called and confirmed appt with pt's husband, also left a message on pt's voicemail confirming her appt for 11-26-18 with Fabian Sharp.g

## 2018-11-25 NOTE — Progress Notes (Signed)
Virtual Visit via Telephone Note   This visit type was conducted due to national recommendations for restrictions regarding the COVID-19 Pandemic (e.g. social distancing) in an effort to limit this patient's exposure and mitigate transmission in our community.  Due to her co-morbid illnesses, this patient is at least at moderate risk for complications without adequate follow up.  This format is felt to be most appropriate for this patient at this time.  The patient did not have access to video technology/had technical difficulties with video requiring transitioning to audio format only (telephone).  All issues noted in this document were discussed and addressed.  No physical exam could be performed with this format.  Please refer to the patient's chart for her  consent to telehealth for [facility].   Date:  11/26/2018   ID:  Lorraine Miller, DOB 01-Sep-1945, MRN [id number]  Patient Location: Home Provider Location: Office  PCP:  Deland Pretty, MD  Cardiologist:  Shelva Majestic, MD  Electrophysiologist:  None   Evaluation Performed:  Follow-Up Visit  Chief Complaint:  Palpitations   History of Present Illness:    Lorraine Miller is a 73 y.o. female with DM, COPD, hyperlipidemia, and depression. Heart catheterization approximately 9 years ago obtained in response to an abnormal stress test with normal coronaries, per the patient (at [place] [facility]). She has a history of near syncope secondary to hypotension vs dehydration. She established care with Dr. Claiborne Billings in Jan 2018, and she had questions regarding a medical record that included heart failure and renal failure in her diagnoses. Echocardiogram 03/15/17 with preserved EF of 55-60%, grade 1 DD, and aortic valve sclerosis. Her renal function is normal. Her lovasatin was switched to crestor for more aggressive lipid lowering. She was last seen in clinic by Dr. Claiborne Billings on 08/14/17 and was doing well at that time. Palpitations are controlled with  lopressor. Carotid US with mild plaque < 39% bilaterally.   She presents today for a yearly follow up, delayed due to pandemic. She has no complaints today. She is very active every day, doing yard work, gardening, and is currently sewing masks. They are not able to go to the [facility]. Pulmonology has advised her to stay inside with the humid air and pollen. Last month had palpitations with hypotension. She relaxes and it generally goes away. 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 She thinks the palpitations occur when she is under stress. Rare hand swelling with upper extremity activity (painting and crafting), resolved with one does of lasix. She takes lasix infrequently, about once every 4-6 months.   The patient does not have symptoms concerning for COVID-19 infection (fever, chills, cough, or new shortness of breath).    Past Medical History:  Diagnosis Date  . Arthritis   . Cancer [facility])    Breast cancer  . Colon polyp   . COPD (chronic obstructive pulmonary disease) ([place])   . Depression   . Diabetes mellitus without complication [facility])    Past Surgical History:  Procedure Laterality Date  . ABDOMINAL HYSTERECTOMY    . ANTERIOR CERVICAL DISCECTOMY    . APPENDECTOMY    . BREAST LUMPECTOMY    . double mastectomy    . PARATHYROIDECTOMY    . POSTERIOR CERVICAL FUSION/FORAMINOTOMY    . surgical breast biopsy    . TONSILLECTOMY       Current Meds  Medication Sig  .  albuterol (PROVENTIL HFA) 108 (90 Base) MCG/ACT inhaler Inhale 2 puffs every 6 (six) hours as needed into the lungs for wheezing or shortness of breath.  . budesonide-formoterol (SYMBICORT) 80-4.5 MCG/ACT inhaler Inhale 2 puffs as directed into the lungs.  . Cholecalciferol (VITAMIN D) 2000 UNITS tablet Take 2,000 Units by mouth daily.  . citalopram (CELEXA) 20 MG tablet Take 1 tablet by mouth daily.  . furosemide (LASIX) 40 MG tablet Take 1  tablet (40 mg total) by mouth daily as needed for edema.  Marland Kitchen losartan (COZAAR) 25 MG tablet Take 1 tablet (25 mg total) by mouth daily.  . Magnesium 400 MG CAPS Take 400 mg by mouth.  . metFORMIN (GLUCOPHAGE) 500 MG tablet Take 1 tablet (500 mg total) by mouth 2 (two) times daily with a meal.  . metoprolol succinate (TOPROL-XL) 25 MG 24 hr tablet Take 0.5 tablets (12.5 mg total) by mouth daily.  . potassium chloride (K-DUR) 10 MEQ tablet Take 1 tablet (10 mEq total) by mouth daily. (Patient taking differently: Take 10 mEq by mouth daily. Only when she takes lasix)  . rosuvastatin (CRESTOR) 10 MG tablet TAKE ONE TABLET BY MOUTH DAILY     Allergies:   Abilify [aripiprazole], Ambien [zolpidem tartrate], and Gabapentin   Social History   Tobacco Use  . Smoking status: Former Smoker    Packs/day: 0.25    Years: 28.00    Pack years: 7.00    Types: Cigarettes    Quit date: 06/03/2001    Years since quitting: 17.4  . Smokeless tobacco: Never Used  Substance Use Topics  . Alcohol use: No    Alcohol/week: 0.0 standard drinks  . Drug use: No     Family Hx: The patient's family history includes Asthma in her daughter, maternal grandfather, mother, and sister; Diabetes in her father and paternal grandfather; Healthy in her maternal grandfather, maternal grandmother, and paternal grandfather; Heart disease in her father; Multiple myeloma in her mother; Stomach cancer in her paternal grandmother.  ROS:   Please see the history of present illness.     All other systems reviewed and are negative.   Prior CV studies:   The following studies were reviewed today:  Carotid dopplers 07/19/16: 1-39% bilateral stenosis   Echo 07/16/17: Study Conclusions  - Left ventricle: The cavity size was normal. Wall thickness was   normal. Systolic function was normal. The estimated ejection   fraction was in the range of 55% to 60%. Wall motion was normal;   there were no regional wall motion  abnormalities. Doppler   parameters are consistent with abnormal left ventricular   relaxation (grade 1 diastolic dysfunction).  Impressions: - Normal LV systolic function; grade 1 diastolic dysfunction;   sclerotic aortic valve.   /Labs/Other Tests and Data Reviewed:    EKG:  An ECG dated 08/14/17 was personally reviewed today and demonstrated:  NSR, heart rate 66  Recent Labs: No results found for requested labs within last 8760 hours.   Recent Lipid Panel Lab Results  Component Value Date/Time   CHOL 129 11/14/2016 08:25 AM   TRIG 77 11/14/2016 08:25 AM   HDL 58 11/14/2016 08:25 AM   CHOLHDL 2.2 11/14/2016 08:25 AM   LDLCALC 56 11/14/2016 08:25 AM    Wt Readings from Last 3 Encounters:  11/26/18 140 lb (63.5 kg)  08/14/17 151 lb (68.5 kg)  06/13/17 148 lb (67.1 kg)     Objective:    Vital Signs:  BP (!) 108/54  Pulse 68   Ht '5\' 4"'  (1.626 m)   Wt 140 lb (63.5 kg)   BMI 24.03 kg/m    VITAL SIGNS:  reviewed GEN:  no acute distress RESPIRATORY:  normal respiratory effort, symmetric expansion NEURO:  alert and oriented x 3, no obvious focal deficit PSYCH:  normal affect  ASSESSMENT & PLAN:     Palpitations On metoprolol. Rare palpitations. Pressures are 100-120/50s. No medication changes.   Chronic diastolic heart failure Grade 1 DD by echo Has PRN lasix. She occasionally takes lasix for hand swelling, every 4-6 weeks.  Euvolemic today.   Hyperlipidemia Changed from lovastatin to crestor.  Followed by PCP. 03/09/2018 labs: Total cholesterol: 121 HDL: 57 LDL: 47  Trigycerides: 86  No medication changes.    COVID-19 Education: The signs and symptoms of COVID-19 were discussed with the patient and how to seek care for testing (follow up with PCP or arrange E-visit).  The importance of social distancing was discussed today.  Time:   Today, I have spent 15 minutes with the patient with telehealth technology discussing the above problems.      Medication Adjustments/Labs and Tests Ordered: Current medicines are reviewed at length with the patient today.  Concerns regarding medicines are outlined above.   Tests Ordered: No orders of the defined types were placed in this encounter.   Medication Changes: No orders of the defined types were placed in this encounter.   Follow Up:  Virtual Visit or In Person in 1 year(s)  Signed, Ledora Bottcher, PA  11/26/2018 9:33 AM    [place]

## 2018-11-26 ENCOUNTER — Telehealth (INDEPENDENT_AMBULATORY_CARE_PROVIDER_SITE_OTHER): Payer: PPO | Admitting: Physician Assistant

## 2018-11-26 ENCOUNTER — Encounter: Payer: Self-pay | Admitting: Physician Assistant

## 2018-11-26 VITALS — BP 108/54 | HR 68 | Ht 64.0 in | Wt 140.0 lb

## 2018-11-26 DIAGNOSIS — R002 Palpitations: Secondary | ICD-10-CM | POA: Diagnosis not present

## 2018-11-26 DIAGNOSIS — I5032 Chronic diastolic (congestive) heart failure: Secondary | ICD-10-CM

## 2018-11-26 DIAGNOSIS — E785 Hyperlipidemia, unspecified: Secondary | ICD-10-CM | POA: Diagnosis not present

## 2018-11-26 NOTE — Patient Instructions (Signed)
Follow-Up: You will need a follow up appointment in 12 months.  Please call our office 2 months in advance, April 2021, to schedule this, June 2021, appointment.  You may see Shelva Majestic, MD or one of the following Advanced Practice Providers on your designated Care Team:  Almyra Deforest, PA-C  Fabian Sharp, [place]      Medication Instructions:  The current medical regimen is effective;  continue present plan and medications as directed. Please refer to the Current Medication list given to you today. If you need a refill on your cardiac medications before your next appointment, please call your pharmacy. Labwork: When you have labs (blood work) and your tests are completely normal, you will receive your results ONLY by [place] (if you have MyChart) -OR- A paper copy in the mail.  At [facility], you and your health needs are our priority.  As part of our continuing mission to provide you with exceptional heart care, we have created designated Provider Care Teams.  These Care Teams include your primary Cardiologist (physician) and Advanced Practice Providers (APPs -  Physician Assistants and Nurse Practitioners) who all work together to provide you with the care you need, when you need it.  Thank you for choosing [facility] at [facility]!!

## 2018-11-30 ENCOUNTER — Telehealth: Payer: PPO | Admitting: Cardiovascular Disease

## 2018-12-21 DIAGNOSIS — E119 Type 2 diabetes mellitus without complications: Secondary | ICD-10-CM | POA: Diagnosis not present

## 2019-01-01 ENCOUNTER — Other Ambulatory Visit: Payer: Self-pay

## 2019-01-21 DIAGNOSIS — Z23 Encounter for immunization: Secondary | ICD-10-CM | POA: Diagnosis not present

## 2019-03-17 DIAGNOSIS — E785 Hyperlipidemia, unspecified: Secondary | ICD-10-CM | POA: Diagnosis not present

## 2019-03-17 DIAGNOSIS — I129 Hypertensive chronic kidney disease with stage 1 through stage 4 chronic kidney disease, or unspecified chronic kidney disease: Secondary | ICD-10-CM | POA: Diagnosis not present

## 2019-03-17 DIAGNOSIS — Z0000 Encounter for general adult medical examination without abnormal findings: Secondary | ICD-10-CM | POA: Diagnosis not present

## 2019-03-17 DIAGNOSIS — E1122 Type 2 diabetes mellitus with diabetic chronic kidney disease: Secondary | ICD-10-CM | POA: Diagnosis not present

## 2019-03-22 DIAGNOSIS — E785 Hyperlipidemia, unspecified: Secondary | ICD-10-CM | POA: Diagnosis not present

## 2019-03-22 DIAGNOSIS — I10 Essential (primary) hypertension: Secondary | ICD-10-CM | POA: Diagnosis not present

## 2019-03-22 DIAGNOSIS — E1122 Type 2 diabetes mellitus with diabetic chronic kidney disease: Secondary | ICD-10-CM | POA: Diagnosis not present

## 2019-03-22 DIAGNOSIS — Z0000 Encounter for general adult medical examination without abnormal findings: Secondary | ICD-10-CM | POA: Diagnosis not present

## 2019-04-13 DIAGNOSIS — Z1211 Encounter for screening for malignant neoplasm of colon: Secondary | ICD-10-CM | POA: Diagnosis not present

## 2019-04-13 DIAGNOSIS — Z1212 Encounter for screening for malignant neoplasm of rectum: Secondary | ICD-10-CM | POA: Diagnosis not present

## 2019-05-02 ENCOUNTER — Other Ambulatory Visit: Payer: Self-pay | Admitting: Cardiovascular Disease

## 2019-06-26 ENCOUNTER — Ambulatory Visit: Payer: PPO | Attending: Internal Medicine

## 2019-06-26 DIAGNOSIS — Z23 Encounter for immunization: Secondary | ICD-10-CM

## 2019-06-26 NOTE — Progress Notes (Signed)
   Covid-19 Vaccination Clinic  Name:  Lorraine Miller    MRN: [id number] DOB: July 17, 1945  06/26/2019  Lorraine Miller was observed post Covid-19 immunization for 15 minutes without incidence. She was provided with Vaccine Information Sheet and instruction to access the V-Safe system.   Lorraine Miller was instructed to call 911 with any severe reactions post vaccine: Marland Kitchen Difficulty breathing  . Swelling of your face and throat  . A fast heartbeat  . A bad rash all over your body  . Dizziness and weakness    Immunizations Administered    Name Date Dose VIS Date Route   Pfizer COVID-19 Vaccine 06/26/2019 12:41 PM 0.3 mL 05/14/2019 Intramuscular   Manufacturer: [place]   Lot: [id number]   [place]: [id number]

## 2019-07-17 ENCOUNTER — Ambulatory Visit: Payer: PPO | Attending: Internal Medicine

## 2019-07-17 DIAGNOSIS — Z23 Encounter for immunization: Secondary | ICD-10-CM | POA: Insufficient documentation

## 2019-07-17 NOTE — Progress Notes (Signed)
   Covid-19 Vaccination Clinic  Name:  Lorraine Miller    MRN: [id number] DOB: 06/08/1945  07/17/2019  [name] was observed post Covid-19 immunization for 15 minutes without incidence. She was provided with Vaccine Information Sheet and instruction to access the V-Safe system.   [name] was instructed to call 911 with any severe reactions post vaccine: Marland Kitchen Difficulty breathing  . Swelling of your face and throat  . A fast heartbeat  . A bad rash all over your body  . Dizziness and weakness    Immunizations Administered    Name Date Dose VIS Date Route   Pfizer COVID-19 Vaccine 07/17/2019 11:21 AM 0.3 mL 05/14/2019 Intramuscular   Manufacturer: [place]   Lot: [id number]   [place]: [id number]

## 2019-09-15 DIAGNOSIS — E785 Hyperlipidemia, unspecified: Secondary | ICD-10-CM | POA: Diagnosis not present

## 2019-09-15 DIAGNOSIS — E1122 Type 2 diabetes mellitus with diabetic chronic kidney disease: Secondary | ICD-10-CM | POA: Diagnosis not present

## 2019-09-20 DIAGNOSIS — I10 Essential (primary) hypertension: Secondary | ICD-10-CM | POA: Diagnosis not present

## 2019-09-20 DIAGNOSIS — E785 Hyperlipidemia, unspecified: Secondary | ICD-10-CM | POA: Diagnosis not present

## 2019-09-20 DIAGNOSIS — E1122 Type 2 diabetes mellitus with diabetic chronic kidney disease: Secondary | ICD-10-CM | POA: Diagnosis not present

## 2019-09-20 DIAGNOSIS — J449 Chronic obstructive pulmonary disease, unspecified: Secondary | ICD-10-CM | POA: Diagnosis not present

## 2019-09-20 DIAGNOSIS — E559 Vitamin D deficiency, unspecified: Secondary | ICD-10-CM | POA: Diagnosis not present

## 2019-10-28 DIAGNOSIS — F411 Generalized anxiety disorder: Secondary | ICD-10-CM | POA: Diagnosis not present

## 2019-10-28 DIAGNOSIS — Z72820 Sleep deprivation: Secondary | ICD-10-CM | POA: Diagnosis not present

## 2019-10-28 DIAGNOSIS — F439 Reaction to severe stress, unspecified: Secondary | ICD-10-CM | POA: Diagnosis not present

## 2019-10-29 ENCOUNTER — Other Ambulatory Visit: Payer: Self-pay | Admitting: Cardiovascular Disease

## 2019-11-05 ENCOUNTER — Other Ambulatory Visit: Payer: Self-pay | Admitting: Cardiovascular Disease

## 2019-12-02 DIAGNOSIS — F411 Generalized anxiety disorder: Secondary | ICD-10-CM | POA: Diagnosis not present

## 2019-12-02 DIAGNOSIS — F439 Reaction to severe stress, unspecified: Secondary | ICD-10-CM | POA: Diagnosis not present

## 2019-12-22 DIAGNOSIS — Z961 Presence of intraocular lens: Secondary | ICD-10-CM | POA: Diagnosis not present

## 2020-01-27 ENCOUNTER — Ambulatory Visit: Payer: PPO | Admitting: Cardiovascular Disease

## 2020-01-27 ENCOUNTER — Encounter: Payer: Self-pay | Admitting: Cardiovascular Disease

## 2020-01-27 ENCOUNTER — Other Ambulatory Visit: Payer: Self-pay

## 2020-01-27 DIAGNOSIS — E119 Type 2 diabetes mellitus without complications: Secondary | ICD-10-CM

## 2020-01-27 DIAGNOSIS — E785 Hyperlipidemia, unspecified: Secondary | ICD-10-CM

## 2020-01-27 DIAGNOSIS — J449 Chronic obstructive pulmonary disease, unspecified: Secondary | ICD-10-CM

## 2020-01-27 DIAGNOSIS — F419 Anxiety disorder, unspecified: Secondary | ICD-10-CM | POA: Diagnosis not present

## 2020-01-27 DIAGNOSIS — R002 Palpitations: Secondary | ICD-10-CM | POA: Diagnosis not present

## 2020-01-27 DIAGNOSIS — F32A Depression, unspecified: Secondary | ICD-10-CM

## 2020-01-27 DIAGNOSIS — I10 Essential (primary) hypertension: Secondary | ICD-10-CM | POA: Diagnosis not present

## 2020-01-27 DIAGNOSIS — F329 Major depressive disorder, single episode, unspecified: Secondary | ICD-10-CM

## 2020-01-27 NOTE — Patient Instructions (Signed)

## 2020-01-27 NOTE — Progress Notes (Signed)
Cardiology Office Note    Date:  01/27/2020   ID:  Miller, Lorraine 30-Nov-1945, MRN [id number]  PCP:  Lorraine Pretty, MD  Cardiologist:  Lorraine Majestic, MD     History of Present Illness:  Lorraine Miller is a 74 y.o. female who established cardiology care with me in January 2018. I last saw her in March 2019.  She presents for a  follow-upcardiology evaluation  Lorraine Miller has a history of diabetes mellitus, COPD, hyperlipidemia, as well as depression.  Over10 years ago, she had undergone a stress test by Dr. Kerrin Miller at the [facility] in [place] which was abnormal and ultimately had a heart catheterization by Dr.[name]..  She was told that her coronaries were normal at that time.  She has a history of breast CA in situ and underwent double mastectomy with subsequent breast reconstructive surgery.  On 05/01/2016 was she was cutting her husband's hair.  She became very lightheaded and presyncopal.  Reportedly her husband had taken her blood pressure which was very low.  By the time EMS arrived, the patient states EMS blood pressure recording was 90/55.  She presented to [facility] and on presentation her blood pressure had risen to 115/52.  CT scan did not reveal any acute abnormality.  She had mild left frontal sinus disease.  A chest x-ray showed COPD with mild bibasal atelectasis glass scarring.  Her ECG was unremarkable.  Troponins were negative.  She again had another short-lived episode of presyncope on New Year's Day, which ultimately resolved.  She is uncertain if she was dehydrated on both of these occasions.  She denied associated chest tightness.  She saw me initially in January 2018 after she had seen on the Lorraine Miller record that she may have  mild renal failure and heart failure.  The patient states she was never told this and presented for cardiologic evaluation.  I reassured her after reviewing [facility] laboratory that she did not have  renal failure, but actually had stage II renal staging.  I scheduled her for 2-D echo Doppler study which was done 03/15/2017 and showed an EF of 55-60%.  There was mild grade 1 diastolic dysfunction with aortic valve sclerosis.  She underwent follow-up laboratory on February 1 18, which showed a creatinine of 0.83.  Lipid studies were notable for total cholesterol 161, HDL 47, LDL 85, and triglycerides 147.    I recommended that she discontinue lovastatin and changed rosuvastatin 10 mg for more aggressive lipid-lowering.  Subsequent laboratory in October 2018 showed improvement in LDL cholesterol down to 54.  When I last saw her in March 2019 she was continued to do well from a cardiac standpoint.  She has continued to feel well from a heart standpoint.  Her blood pressure has always been normal.  She has had issues with left leg discomfort.  S  She has had issues remotely of cervical neck disease but does not recall being evaluated for lumbar sacral disease.  She also at times notes that her fingers get cold.  There is remote tobacco history but she quit smoking in 2003.  He does not require being cold temperatures for this sensation.    Since I last saw her, she was evaluated in a telemedicine visit by Lorraine Miller in June 2020 and at that time remained cardiac stable.  Over the past year, she has continued to do well.  She specifically denies chest pain or shortness of breath.  She does experience a rare palpitation which typically are stress mediated.  At times she does have considerable stress with her husband who has some heart failure issues.  She is followed by Dr. Loney Miller and he recently increased her Celexa dose.  She also was having some sleep issues and she is now sleeping well with the addition of Lunesta 2 mg.  She underwent repeat laboratory in April 2021 by Dr. Loney Miller.  Renal function was excellent with a creatinine of 0.85.  LFTs were normal.  Hemoglobin A1c was 6.2.  LDL cholesterol was 49.  She  presents for evaluation.  Past Medical History:  Diagnosis Date  . Arthritis   . Cancer [facility])    Breast cancer  . Colon polyp   . COPD (chronic obstructive pulmonary disease) ([place])   . Depression   . Diabetes mellitus without complication [facility])     Past Surgical History:  Procedure Laterality Date  . ABDOMINAL HYSTERECTOMY    . ANTERIOR CERVICAL DISCECTOMY    . APPENDECTOMY    . BREAST LUMPECTOMY    . double mastectomy    . PARATHYROIDECTOMY    . POSTERIOR CERVICAL FUSION/FORAMINOTOMY    . surgical breast biopsy    . TONSILLECTOMY      Current Medications: Outpatient Medications Prior to Visit  Medication Sig Dispense Refill  . albuterol (PROVENTIL HFA) 108 (90 Base) MCG/ACT inhaler Inhale 2 puffs every 6 (six) hours as needed into the lungs for wheezing or shortness of breath. 1 Inhaler 11  . budesonide-formoterol (SYMBICORT) 80-4.5 MCG/ACT inhaler Inhale 2 puffs as directed into the lungs. 1 Inhaler 11  . Cholecalciferol (VITAMIN D) 2000 UNITS tablet Take 2,000 Units by mouth daily.    . citalopram (CELEXA) 20 MG tablet     . eszopiclone (LUNESTA) 2 MG TABS tablet     . furosemide (LASIX) 40 MG tablet Take 1 tablet (40 mg total) by mouth daily as needed for edema. 30 tablet 2  . losartan (COZAAR) 25 MG tablet Take 1 tablet (25 mg total) by mouth daily. 90 tablet 0  . Magnesium 400 MG CAPS Take 400 mg by mouth.    . metFORMIN (GLUCOPHAGE) 500 MG tablet Take 1 tablet (500 mg total) by mouth 2 (two) times daily with a meal. 60 tablet 2  . metoprolol succinate (TOPROL-XL) 25 MG 24 hr tablet Take 0.5 tablets (12.5 mg total) by mouth daily. KEEP OV. 45 tablet 0  . potassium chloride (K-DUR) 10 MEQ tablet Take 1 tablet (10 mEq total) by mouth daily. (Patient taking differently: Take 10 mEq by mouth daily. Only when she takes lasix) 30 tablet 2  . rosuvastatin (CRESTOR) 10 MG tablet TAKE ONE TABLET BY MOUTH DAILY 90 tablet 2  . citalopram (CELEXA) 20 MG tablet Take 1 tablet by  mouth daily.     No facility-administered medications prior to visit.     Allergies:   Abilify [aripiprazole], Ambien [zolpidem tartrate], and Gabapentin   Social History   Socioeconomic History  . Marital status: Married    Spouse name: Not on file  . Number of children: 2  . Years of education: 90  . Highest education level: Not on file  Occupational History  . Not on file  Tobacco Use  . Smoking status: Former Smoker    Packs/day: 0.25    Years: 28.00    Pack years: 7.00    Types: Cigarettes    Quit date: 06/03/2001    Years since quitting: 18.6  .  Smokeless tobacco: Never Used  Substance and Sexual Activity  . Alcohol use: No    Alcohol/week: 0.0 standard drinks  . Drug use: No  . Sexual activity: Not on file  Other Topics Concern  . Not on file  Social History Narrative   Fun: Scientist, research (life sciences) and crafts   Denies religious beliefs effecting health care.   Denies abuse and feels safe at home.    Social Determinants of Health   Financial Resource Strain:   . Difficulty of Paying Living Expenses: Not on file  Food Insecurity:   . Worried About Charity fundraiser in the Last Year: Not on file  . Ran Out of Food in the Last Year: Not on file  Transportation Needs:   . Lack of Transportation (Medical): Not on file  . Lack of Transportation (Non-Medical): Not on file  Physical Activity:   . Days of Exercise per Week: Not on file  . Minutes of Exercise per Session: Not on file  Stress:   . Feeling of Stress : Not on file  Social Connections:   . Frequency of Communication with Friends and Family: Not on file  . Frequency of Social Gatherings with Friends and Family: Not on file  . Attends Religious Services: Not on file  . Active Member of Clubs or Organizations: Not on file  . Attends Archivist Meetings: Not on file  . Marital Status: Not on file     Family History:  The patient's family history includes Asthma in her daughter, maternal grandfather, mother,  and sister; Diabetes in her father and paternal grandfather; Healthy in her maternal grandfather, maternal grandmother, and paternal grandfather; Heart disease in her father; Multiple myeloma in her mother; Stomach cancer in her paternal grandmother.   ROS General: Negative; No fevers, chills, or night sweats;  HEENT: Negative; No changes in vision or hearing, sinus congestion, difficulty swallowing Pulmonary: Negative; No cough, wheezing, shortness of breath, hemoptysis Cardiovascular: See history of present illness GI: Negative; No nausea, vomiting, diarrhea, or abdominal pain GU: Negative; No dysuria, hematuria, or difficulty voiding Musculoskeletal: Negative; no myalgias, joint pain, or weakness Hematologic/Oncology: Negative; no easy bruising, bleeding Endocrine: Positive for diabetes mellitus Neuro: Negative; no changes in balance, headaches Skin: Negative; No rashes or skin lesions Psychiatric: Positive for increased stress, improved, remote history of depression Sleep: Negative; No snoring, daytime sleepiness, hypersomnolence, bruxism, restless legs, hypnogognic hallucinations, no cataplexy Other comprehensive 14 point system review is negative.   PHYSICAL EXAM:   VS:  BP 118/70   Pulse 72   Temp (!) 96.8 F (36 C)   Ht $R'5\' 6"'Do$  (1.676 m)   Wt 148 lb 12.8 oz (67.5 kg)   SpO2 97%   BMI 24.02 kg/m     Repeat blood pressure by me was 118/68  Wt Readings from Last 3 Encounters:  01/27/20 148 lb 12.8 oz (67.5 kg)  11/26/18 140 lb (63.5 kg)  08/14/17 151 lb (68.5 kg)       Physical Exam BP 118/70   Pulse 72   Temp (!) 96.8 F (36 C)   Ht $R'5\' 6"'lC$  (1.676 m)   Wt 148 lb 12.8 oz (67.5 kg)   SpO2 97%   BMI 24.02 kg/m  General: Alert, oriented, no distress.  Skin: normal turgor, no rashes, warm and dry HEENT: Normocephalic, atraumatic. Pupils equal round and reactive to light; sclera anicteric; extraocular muscles intact; Fundi ** Nose without nasal septal  hypertrophy Mouth/Parynx benign; Mallinpatti scale Neck: No JVD,  no carotid bruits; normal carotid upstroke Lungs: clear to ausculatation and percussion; no wheezing or rales Chest wall: without tenderness to palpitation Heart: PMI not displaced, RRR, s1 s2 normal, 1/6 systolic murmur, no diastolic murmur, no rubs, gallops, thrills, or heaves Abdomen: soft, nontender; no hepatosplenomehaly, BS+; abdominal aorta nontender and not dilated by palpation. Back: no CVA tenderness Pulses 2+ Musculoskeletal: full range of motion, normal strength, no joint deformities Extremities: no clubbing cyanosis or edema, Homan's sign negative  Neurologic: grossly nonfocal; Cranial nerves grossly wnl Psychologic: Normal mood and affect    Studies/Labs Reviewed:   ECG (independently read by me): Normal sinus rhythm at 72 bpm; no ST changes, normal intervals  March 2019 ECG (independently read by me): sinus rhythm at 66 bpm.  No ectopy.  Normal intervals.  No ST segment changes.  October 2018 ECG (independently read by me): normal sinus rhythm at 80 bpm.  Normal intervals.  No ectopy.  March 2018 ECG (independently read by me): Normal sinus rhythm at 81 bpm.  Normal intervals.  No ectopy.  No ST segment changes.  January 2018 EKG:  EKG is ordered today.  ECG (independently read by me): Normal sinus rhythm at 77 bpm.  No significant ST-T changes.  PR interval 114 ms; QRS duration 84 ms; QTc interval normal at 411 milliseconds.  Recent Labs: BMP Latest Ref Rng & Units 11/14/2016 07/04/2016 05/01/2016  Glucose 65 - 99 mg/dL 116(H) 117(H) 110(H)  BUN 7 - 25 mg/dL _0 Creatinine 0.60 - 0.93 mg/dL 0.84 0.83 0.77  Sodium 135 - 146 mmol/L 139 140 141  Potassium 3.5 - 5.3 mmol/L 4.6 4.3 3.8  Chloride 98 - 110 mmol/L 103 104 105  CO2 20 - 31 mmol/L _1 Calcium 8.6 - 10.4 mg/dL 9.3 8.9 9.4     Hepatic Function Latest Ref Rng & Units 12/16/2016 11/14/2016 07/04/2016  Total Protein 6.0 - 8.5 g/dL 6.2 6.2  6.3  Albumin 3.5 - 4.8 g/dL 4.7 4.3 4.1  AST 0 - 40 IU/L _2 ALT 0 - 32 IU/L 30 42(H) 24  Alk Phosphatase 39 - 117 IU/L 61 60 55  Total Bilirubin 0.0 - 1.2 mg/dL 0.9 1.2 1.6(H)  Bilirubin, Direct 0.00 - 0.40 mg/dL 0.24 - -    CBC Latest Ref Rng & Units 07/04/2016 05/01/2016 12/20/2013  WBC 3.8 - 10.8 K/uL 6.1 10.5 8.7  Hemoglobin 11.7 - 15.5 g/dL 13.9 14.1 13.9  Hematocrit 35 - 45 % 40.7 41.5 42.3  Platelets 140 - 400 K/uL 343 330 320   Lab Results  Component Value Date   MCV 87.5 07/04/2016   MCV 87.7 05/01/2016   MCV 91 12/20/2013   Lab Results  Component Value Date   TSH 3.35 07/04/2016   Lab Results  Component Value Date   HGBA1C 6.0 (H) 07/04/2016     BNP No results found for: BNP  ProBNP No results found for: PROBNP   Lipid Panel     Component Value Date/Time   CHOL 129 11/14/2016 0825   TRIG 77 11/14/2016 0825   HDL 58 11/14/2016 0825   CHOLHDL 2.2 11/14/2016 0825   VLDL 15 11/14/2016 0825   LDLCALC 56 11/14/2016 0825     RADIOLOGY: No results found.   Additional studies/ records that were reviewed today include:  I reviewed the emergency room records as well as records from [facility], recent labs and echo.     ASSESSMENT:  1. Essential hypertension   2. Palpitations   3. Hyperlipidemia with target LDL less than 70   4. Type 2 diabetes mellitus without complication, without long-term current use of insulin ([place])   5. COPD GOLD II    6. Anxiety and depression     PLAN:  Lorraine Miller is a 74 year old female who has a history of type 2 diabetes mellitus, hypertension, hyperlipidemia, and COPD.   A nuclear stress test done over 10 years ago raised concern for ischemia and definitive cardiac catheterization done in [place] revealed normal coronary arteries.  She had experienced 2 episodes of presyncope while standing.  When I initially saw her, she was not orthostatic.  I reduced her diuretic regimen.  She does  not have renal failure and does not have signs of CHF.  Her echo Doppler study of February 2018 revealed normal systolic function although there was evidence for mild grade 1 diastolic dysfunction. .  Carotid studies revealed mild plaque bilaterally, less than 39%.  Since her last evaluation with me, she has remained cardiac stable.  Her blood pressure is well controlled today on her low-dose losartan 25 mg in addition to metoprolol succinate 12.5 mg daily.  At times she does admit to increased stress on the home front.  This has been associated with some occasional palpitations.  Her stress level is now improved since her dose of Celexa was increased by Dr. Deland Miller.  She also is sleeping well on Lunesta.  I reviewed her recent laboratory which is excellent.  Hemoglobin A1c is 6.2 on metformin 500 mg twice a day.  Renal function is stable with a creatinine of 0.85 on low-dose losartan.  Lipid studies are excellent with an LDL of 49 on rosuvastatin 10 mg.  She continues to take Symbicort and as needed albuterol for her COPD which is stable.  There is no wheezing.  She walks on a daily basis and feels well without exertional dyspnea.  She will continue current therapy.  I will see her in 1 year for reevaluation or sooner as needed.   Medication Adjustments/Labs and Tests Ordered: Current medicines are reviewed at length with the patient today.  Concerns regarding medicines are outlined above.  Medication changes, Labs and Tests ordered today are listed in the Patient Instructions below. Patient Instructions  Medication Instructions:  CONTINUE WITH CURRENT MEDICATIONS. NO CHANGES.  *If you need a refill on your cardiac medications before your next appointment, please call your pharmacy*     Follow-Up: At [facility], you and your health needs are our priority.  As part of our continuing mission to provide you with exceptional heart care, we have created designated Provider Care Teams.  These Care  Teams include your primary Cardiologist (physician) and Advanced Practice Providers (APPs -  Physician Assistants and Nurse Practitioners) who all work together to provide you with the care you need, when you need it.  We recommend signing up for the patient portal called "MyChart".  Sign up information is provided on this After Visit Summary.  MyChart is used to connect with patients for Virtual Visits (Telemedicine).  Patients are able to view lab/test results, encounter notes, upcoming appointments, etc.  Non-urgent messages can be sent to your provider as well.   To learn more about what you can do with MyChart, go to [email or web address].    Your next appointment:   12 month(s)  The format for your next appointment:   In Person  Provider:   Shelva Majestic,  MD        Signed, Lorraine Majestic, MD , [facility] 01/27/2020 8:31 AM    [facility] [address] [place], [place],   [address] Phone: [phone]

## 2020-02-02 ENCOUNTER — Other Ambulatory Visit: Payer: Self-pay | Admitting: Cardiovascular Disease

## 2020-03-23 DIAGNOSIS — E785 Hyperlipidemia, unspecified: Secondary | ICD-10-CM | POA: Diagnosis not present

## 2020-03-23 DIAGNOSIS — E1122 Type 2 diabetes mellitus with diabetic chronic kidney disease: Secondary | ICD-10-CM | POA: Diagnosis not present

## 2020-03-28 DIAGNOSIS — F339 Major depressive disorder, recurrent, unspecified: Secondary | ICD-10-CM | POA: Diagnosis not present

## 2020-03-28 DIAGNOSIS — J449 Chronic obstructive pulmonary disease, unspecified: Secondary | ICD-10-CM | POA: Diagnosis not present

## 2020-03-28 DIAGNOSIS — Z0000 Encounter for general adult medical examination without abnormal findings: Secondary | ICD-10-CM | POA: Diagnosis not present

## 2020-03-28 DIAGNOSIS — E1122 Type 2 diabetes mellitus with diabetic chronic kidney disease: Secondary | ICD-10-CM | POA: Diagnosis not present

## 2020-03-28 DIAGNOSIS — I129 Hypertensive chronic kidney disease with stage 1 through stage 4 chronic kidney disease, or unspecified chronic kidney disease: Secondary | ICD-10-CM | POA: Diagnosis not present

## 2020-03-28 DIAGNOSIS — R55 Syncope and collapse: Secondary | ICD-10-CM | POA: Diagnosis not present

## 2020-03-28 DIAGNOSIS — M8580 Other specified disorders of bone density and structure, unspecified site: Secondary | ICD-10-CM | POA: Diagnosis not present

## 2020-04-10 ENCOUNTER — Telehealth: Payer: PPO | Admitting: Physician Assistant

## 2020-04-12 DIAGNOSIS — R55 Syncope and collapse: Secondary | ICD-10-CM | POA: Diagnosis not present

## 2020-04-12 DIAGNOSIS — R002 Palpitations: Secondary | ICD-10-CM | POA: Diagnosis not present

## 2020-04-18 DIAGNOSIS — R002 Palpitations: Secondary | ICD-10-CM | POA: Diagnosis not present

## 2020-04-19 ENCOUNTER — Telehealth: Payer: PPO | Admitting: Physician Assistant

## 2020-04-19 NOTE — Progress Notes (Signed)
Virtual Visit via Telephone Note   This visit type was conducted due to national recommendations for restrictions regarding the COVID-19 Pandemic (e.g. social distancing) in an effort to limit this patient's exposure and mitigate transmission in our community.  Due to her co-morbid illnesses, this patient is at least at moderate risk for complications without adequate follow up.  This format is felt to be most appropriate for this patient at this time.  The patient did not have access to video technology/had technical difficulties with video requiring transitioning to audio format only (telephone).  All issues noted in this document were discussed and addressed.  No physical exam could be performed with this format.  Please refer to the patient's chart for her  consent to telehealth for [facility].    Date:  04/24/2020   ID:  Lorraine Miller, DOB 1945/12/08, MRN [id number] The patient was identified using 2 identifiers.  Patient Location: Home Provider Location: Home Office  PCP:  Deland Pretty, MD  Cardiologist:  Shelva Majestic, MD  Electrophysiologist:  None   Evaluation Performed:  Follow-Up Visit  Chief Complaint:  syncope  History of Present Illness:    Lorraine Miller is a 74 y.o. female with PMH of HLD, DM type 2, COPD, breast cancer s/p b/l mastectomy, and depression, who presents for the evaluation of syncope.   She was last evaluated by cardiology at an outpatient visit with Dr. Claiborne Billings 01/2020, at which time she reported some palpitations which seemed to be stress mediated in the setting of health issues with her husband, though otherwise without cardiac complaints. No medication changes occurred and she was recommended to follow-up in 1 year or sooner if needed. Her last echocardiogram in 2018 showed EF 55-60%, G1DD, no RWMA, and no significant valvular abnormalities. She has a history of remote cardiac catheterization in 2014 to evaluate an abnormal echocardiogram which  reportedly showed normal coronaries. Her last ischemic evaluation was a stress echocardiogram in 2015 which was normal.   She presents today for the evaluation of syncope. She has had this issue in the past related to low blood pressures. She reports approximately 2 months ago she experienced a syncopal episode precipitated by lightheadedness and low blood pressures. She reports just prior to this episode she took her dog for his usual walk and felt fine upon returning home. She was standing in the kitchen shortly thereafter when she felt a sudden weakness in her legs. She sat down and felt swimmy headed, lowered her head between her knees and symptoms initially improved. She felt well enough to walk into her dining room where where she took her vitals and reported a BP 73/41 and HR 89. She took the blood pressure cuff off, then the next thing she remembers is waking up face down on the table with her son on the phone with EMS. She had vomited while passed out but does not recall feeling nauseated prior to this. She reported having a heart racing sensation prior to passing out which she often feels when her blood pressure is low. No complaints of chest pain or SOB surrounding her syncopal episode. She has not had any syncope or near syncope since that time. She does report having the occasional heart racing sensation though never sees a HR >100 when she checks her vitals, though often has a correlated low BP. She has no complaints of exertional chest pain, DOE, orthopnea, or PND. She has rare LE edema which is managed with her prn lasix.  She was seen by her PCP shortly after her syncopal events and reports undergoing a 7 day zio patch monitor which was reportedly unrevealing, though is not available for my review. .   The patient does not have symptoms concerning for COVID-19 infection (fever, chills, cough, or new shortness of breath).     Past Medical History:  Diagnosis Date  . Arthritis   . Cancer  [facility])    Breast cancer  . Colon polyp   . COPD (chronic obstructive pulmonary disease) ([place])   . Depression   . Diabetes mellitus without complication [facility])    Past Surgical History:  Procedure Laterality Date  . ABDOMINAL HYSTERECTOMY    . ANTERIOR CERVICAL DISCECTOMY    . APPENDECTOMY    . BREAST LUMPECTOMY    . double mastectomy    . PARATHYROIDECTOMY    . POSTERIOR CERVICAL FUSION/FORAMINOTOMY    . surgical breast biopsy    . TONSILLECTOMY       Current Meds  Medication Sig  . albuterol (PROVENTIL HFA) 108 (90 Base) MCG/ACT inhaler Inhale 2 puffs every 6 (six) hours as needed into the lungs for wheezing or shortness of breath.  . budesonide-formoterol (SYMBICORT) 80-4.5 MCG/ACT inhaler Inhale 2 puffs as directed into the lungs.  . Cholecalciferol (VITAMIN D) 2000 UNITS tablet Take 2,000 Units by mouth daily.  . citalopram (CELEXA) 20 MG tablet Take 30 mg by mouth daily. 1 1/2 Tablet Daily  . eszopiclone (LUNESTA) 2 MG TABS tablet   . furosemide (LASIX) 40 MG tablet Take 1 tablet (40 mg total) by mouth daily as needed for edema.  . Magnesium 400 MG CAPS Take 400 mg by mouth.  . metFORMIN (GLUCOPHAGE) 500 MG tablet Take 1 tablet (500 mg total) by mouth 2 (two) times daily with a meal.  . metoprolol succinate (TOPROL-XL) 25 MG 24 hr tablet TAKE 1/2 TABLET BY MOUTH DAILY  . potassium chloride (K-DUR) 10 MEQ tablet Take 1 tablet (10 mEq total) by mouth daily. (Patient taking differently: Take 10 mEq by mouth daily. Only when she takes lasix)  . rosuvastatin (CRESTOR) 10 MG tablet TAKE ONE TABLET BY MOUTH DAILY  . [DISCONTINUED] losartan (COZAAR) 25 MG tablet Take 1 tablet (25 mg total) by mouth daily.     Allergies:   Abilify [aripiprazole], Ambien [zolpidem tartrate], and Gabapentin   Social History   Tobacco Use  . Smoking status: Former Smoker    Packs/day: 0.25    Years: 28.00    Pack years: 7.00    Types: Cigarettes    Quit date: 06/03/2001    Years since quitting:  18.9  . Smokeless tobacco: Never Used  Substance Use Topics  . Alcohol use: No    Alcohol/week: 0.0 standard drinks  . Drug use: No     Family Hx: The patient's family history includes Asthma in her daughter, maternal grandfather, mother, and sister; Diabetes in her father and paternal grandfather; Healthy in her maternal grandfather, maternal grandmother, and paternal grandfather; Heart disease in her father; Multiple myeloma in her mother; Stomach cancer in her paternal grandmother.  ROS:   Please see the history of present illness.     All other systems reviewed and are negative.   Prior CV studies:   The following studies were reviewed today:  Echocardiogram 2018: - Left ventricle: The cavity size was normal. Wall thickness was  normal. Systolic function was normal. The estimated ejection  fraction was in the range of 55% to 60%. Wall motion  was normal;  there were no regional wall motion abnormalities. Doppler  parameters are consistent with abnormal left ventricular  relaxation (grade 1 diastolic dysfunction).   Impressions:   - Normal LV systolic function; grade 1 diastolic dysfunction;  sclerotic aortic valve.   Labs/Other Tests and Data Reviewed:    EKG:  No ECG reviewed.  Recent Labs: No results found for requested labs within last 8760 hours.   Recent Lipid Panel Lab Results  Component Value Date/Time   CHOL 129 11/14/2016 08:25 AM   TRIG 77 11/14/2016 08:25 AM   HDL 58 11/14/2016 08:25 AM   CHOLHDL 2.2 11/14/2016 08:25 AM   LDLCALC 56 11/14/2016 08:25 AM    Wt Readings from Last 3 Encounters:  04/24/20 144 lb (65.3 kg)  01/27/20 148 lb 12.8 oz (67.5 kg)  11/26/18 140 lb (63.5 kg)     Risk Assessment/Calculations:      Objective:    Vital Signs:  BP (!) 110/55   Pulse 61   Ht '5\' 6"'  (1.676 m)   Wt 144 lb (65.3 kg)   BMI 23.24 kg/m    VITAL SIGNS:  reviewed GEN:  no acute distress RESPIRATORY:  speaking in full sentences  without SOB CARDIOVASCULAR:  no peripheral edema NEURO:  A&O x3 PSYCH:  normal affect  ASSESSMENT & PLAN:    1. Syncope/palpitations: Occurred 2 months ago without recurrent symptoms since that time. BP was 73/41 at the time of syncope, though she did have some tachypalpitations and sudden LOC with vomiting while sitting down c/f cardiogenic syncope. PCP arranged a 7 day zio patch monitor which was reportedly unrevealing. BP is 110/55 today.  - Favor stopping losartan at this time - Will attempt to obtain records from zio patch monitor from PCP - Will update an echocardiogram to evaluate LV function, wall motion, and valvular function - If symptoms of palpitations/lightheadedness persists despite improvement in BP, may be worthwhile repeating a cardiac monitor as she does not recall having any symptoms while wearing the 7 day zio patch monitor.  - Can continue metoprolol for now  2. HLD: No recent lipids on file - Continue crestor  3. DM type 2: No recent A1C on file - Continue metformin per PCP  COVID-19 Education: The signs and symptoms of COVID-19 were discussed with the patient and how to seek care for testing (follow up with PCP or arrange E-visit).  The importance of social distancing was discussed today.  Time:   Today, I have spent 21 minutes with the patient with telehealth technology discussing the above problems.     Medication Adjustments/Labs and Tests Ordered: Current medicines are reviewed at length with the patient today.  Concerns regarding medicines are outlined above.   Tests Ordered: Orders Placed This Encounter  Procedures  . ECHOCARDIOGRAM COMPLETE    Medication Changes: No orders of the defined types were placed in this encounter.   Follow Up:  Follow-up with me in 1 month - virtual or in person   Signed, Abigail Butts, PA-C  04/24/2020 9:56 AM    [place]

## 2020-04-24 ENCOUNTER — Telehealth (INDEPENDENT_AMBULATORY_CARE_PROVIDER_SITE_OTHER): Payer: PPO | Admitting: Medical

## 2020-04-24 ENCOUNTER — Encounter: Payer: Self-pay | Admitting: Medical

## 2020-04-24 ENCOUNTER — Telehealth: Payer: Self-pay | Admitting: *Deleted

## 2020-04-24 ENCOUNTER — Telehealth: Payer: Self-pay

## 2020-04-24 VITALS — BP 110/55 | HR 61 | Ht 66.0 in | Wt 144.0 lb

## 2020-04-24 DIAGNOSIS — R55 Syncope and collapse: Secondary | ICD-10-CM | POA: Diagnosis not present

## 2020-04-24 DIAGNOSIS — R002 Palpitations: Secondary | ICD-10-CM

## 2020-04-24 DIAGNOSIS — E119 Type 2 diabetes mellitus without complications: Secondary | ICD-10-CM

## 2020-04-24 DIAGNOSIS — E785 Hyperlipidemia, unspecified: Secondary | ICD-10-CM

## 2020-04-24 NOTE — Telephone Encounter (Signed)
Called Ms. Torti to give AVS instructions. Advised will sent copy out in mail today

## 2020-04-24 NOTE — Patient Instructions (Addendum)
Medication Instructions:  Stop Losartan *If you need a refill on your cardiac medications before your next appointment, please call your pharmacy*   Lab Work: No Labs If you have labs (blood work) drawn today and your tests are completely normal, you will receive your results only by: Marland Kitchen MyChart Message (if you have MyChart) OR . A paper copy in the mail If you have any lab test that is abnormal or we need to change your treatment, we will call you to review the results.   Testing/Procedures: [address]  Your physician has requested that you have an echocardiogram. Echocardiography is a painless test that uses sound waves to create images of your heart. It provides your doctor with information about the size and shape of your heart and how well your heart's chambers and valves are working. This procedure takes approximately one hour. There are no restrictions for this procedure.     Follow-Up: At [facility], you and your health needs are our priority.  As part of our continuing mission to provide you with exceptional heart care, we have created designated Provider Care Teams.  These Care Teams include your primary Cardiologist (physician) and Advanced Practice Providers (APPs -  Physician Assistants and Nurse Practitioners) who all work together to provide you with the care you need, when you need it.      Your next appointment:   1 month(s)  The format for your next appointment:   In Person  Provider:  Roby Lofts PA-C    Other Instructions Orthostatic Hypotension Blood pressure is a measurement of how strongly, or weakly, your blood is pressing against the walls of your arteries. Orthostatic hypotension is a sudden drop in blood pressure that happens when you quickly change positions, such as when you get up from sitting or lying down. Arteries are blood vessels that carry blood from your heart throughout your body. When blood pressure is too low,  you may not get enough blood to your brain or to the rest of your organs. This can cause weakness, light-headedness, rapid heartbeat, and fainting. This can last for just a few seconds or for up to a few minutes. Orthostatic hypotension is usually not a serious problem. However, if it happens frequently or gets worse, it may be a sign of something more serious. What are the causes? This condition may be caused by:  Sudden changes in posture, such as standing up quickly after you have been sitting or lying down.  Blood loss.  Loss of body fluids (dehydration).  Heart problems.  Hormone (endocrine) problems.  Pregnancy.  Severe infection.  Lack of certain nutrients.  Severe allergic reactions (anaphylaxis).  Certain medicines, such as blood pressure medicine or medicines that make the body lose excess fluids (diuretics). Sometimes, this condition can be caused by not taking medicine as directed, such as taking too much of a certain medicine. What increases the risk? The following factors may make you more likely to develop this condition:  Age. Risk increases as you get older.  Conditions that affect the heart or the central nervous system.  Taking certain medicines, such as blood pressure medicine or diuretics.  Being pregnant. What are the signs or symptoms? Symptoms of this condition may include:  Weakness.  Light-headedness.  Dizziness.  Blurred vision.  Fatigue.  Rapid heartbeat.  Fainting, in severe cases. How is this diagnosed? This condition is diagnosed based on:  Your medical history.  Your symptoms.  Your blood pressure  measurement. Your health care provider will check your blood pressure when you are: ? Lying down. ? Sitting. ? Standing. A blood pressure reading is recorded as two numbers, such as "120 over 80" (or 120/80). The first ("top") number is called the systolic pressure. It is a measure of the pressure in your arteries as your heart  beats. The second ("bottom") number is called the diastolic pressure. It is a measure of the pressure in your arteries when your heart relaxes between beats. Blood pressure is measured in a unit called mm Hg. Healthy blood pressure for most adults is 120/80. If your blood pressure is below 90/60, you may be diagnosed with hypotension. Other information or tests that may be used to diagnose orthostatic hypotension include:  Your other vital signs, such as your heart rate and temperature.  Blood tests.  Tilt table test. For this test, you will be safely secured to a table that moves you from a lying position to an upright position. Your heart rhythm and blood pressure will be monitored during the test. How is this treated? This condition may be treated by:  Changing your diet. This may involve eating more salt (sodium) or drinking more water.  Taking medicines to raise your blood pressure.  Changing the dosage of certain medicines you are taking that might be lowering your blood pressure.  Wearing compression stockings. These stockings help to prevent blood clots and reduce swelling in your legs. In some cases, you may need to go to the hospital for:  Fluid replacement. This means you will receive fluids through an IV.  Blood replacement. This means you will receive donated blood through an IV (transfusion).  Treating an infection or heart problems, if this applies.  Monitoring. You may need to be monitored while medicines that you are taking wear off. Follow these instructions at home: Eating and drinking   Drink enough fluid to keep your urine pale yellow.  Eat a healthy diet, and follow instructions from your health care provider about eating or drinking restrictions. A healthy diet includes: ? Fresh fruits and vegetables. ? Whole grains. ? Lean meats. ? Low-fat dairy products.  Eat extra salt only as directed. Do not add extra salt to your diet unless your health care  provider told you to do that.  Eat frequent, small meals.  Avoid standing up suddenly after eating. Medicines  Take over-the-counter and prescription medicines only as told by your health care provider. ? Follow instructions from your health care provider about changing the dosage of your current medicines, if this applies. ? Do not stop or adjust any of your medicines on your own. General instructions   Wear compression stockings as told by your health care provider.  Get up slowly from lying down or sitting positions. This gives your blood pressure a chance to adjust.  Avoid hot showers and excessive heat as directed by your health care provider.  Return to your normal activities as told by your health care provider. Ask your health care provider what activities are safe for you.  Do not use any products that contain nicotine or tobacco, such as cigarettes, e-cigarettes, and chewing tobacco. If you need help quitting, ask your health care provider.  Keep all follow-up visits as told by your health care provider. This is important. Contact a health care provider if you:  Vomit.  Have diarrhea.  Have a fever for more than 2-3 days.  Feel more thirsty than usual.  Feel weak and tired.  Get help right away if you:  Have chest pain.  Have a fast or irregular heartbeat.  Develop numbness in any part of your body.  Cannot move your arms or your legs.  Have trouble speaking.  Become sweaty or feel light-headed.  Faint.  Feel short of breath.  Have trouble staying awake.  Feel confused. Summary  Orthostatic hypotension is a sudden drop in blood pressure that happens when you quickly change positions.  Orthostatic hypotension is usually not a serious problem.  It is diagnosed by having your blood pressure taken lying down, sitting, and then standing.  It may be treated by changing your diet or adjusting your medicines. This information is not intended to  replace advice given to you by your health care provider. Make sure you discuss any questions you have with your health care provider.

## 2020-04-24 NOTE — Telephone Encounter (Signed)
Spoke with patient regarding appointment for Echocardiogram ordered by Roby Lofts, PA-----scheduled Monday 05/22/20 at 8:35 am at [place].  Patient voiced her understanding.

## 2020-05-22 ENCOUNTER — Ambulatory Visit (HOSPITAL_COMMUNITY): Payer: PPO | Attending: Cardiovascular Disease

## 2020-05-22 ENCOUNTER — Other Ambulatory Visit: Payer: Self-pay

## 2020-05-22 DIAGNOSIS — R002 Palpitations: Secondary | ICD-10-CM | POA: Insufficient documentation

## 2020-05-22 DIAGNOSIS — E785 Hyperlipidemia, unspecified: Secondary | ICD-10-CM | POA: Diagnosis not present

## 2020-05-22 DIAGNOSIS — E119 Type 2 diabetes mellitus without complications: Secondary | ICD-10-CM | POA: Diagnosis not present

## 2020-05-22 DIAGNOSIS — R55 Syncope and collapse: Secondary | ICD-10-CM | POA: Diagnosis not present

## 2020-05-22 LAB — ECHOCARDIOGRAM COMPLETE
Area-P 1/2: 4.77 cm2
S' Lateral: 2.55 cm

## 2020-05-29 NOTE — Progress Notes (Signed)
Cardiology Office Note   Date:  05/31/2020   ID:  Lorraine, Miller April 27, 1946, MRN [id number]  PCP:  Deland Pretty, MD  Cardiologist:  Shelva Majestic, MD EP: None  Chief Complaint  Patient presents with  . Follow-up    Follow-up      History of Present Illness: Lorraine Miller is a 74 y.o. female with PMH of HLD, DM type 2, COPD, breast cancer s/p b/l mastectomy, and depression who presents for 1 month follow-up of her recent syncopal event.   I saw [name] via a telemedicine visit 04/24/20, at which time she reported a syncopal episodes 2 months prior which was precipitated by lightheadedness and low blood pressure. She reported a racing heart beat sensation prior to passing out which she states is a sensation she frequently gets when her blood pressure is low. She was evaluated by her PCP shortly after this episode and worse a 7 day zio patch monitor which she reports was unrevealing, though not available for my review. Her losartan was discontinued at that time and she was recommended to repeat an echocardiogram which showed EF 60-65%, indeterminate LV diastolic function, no RWMA, and no significant valvular abnormalities.   She presents today for close follow-up. She denies recurrent syncope since her last visit. She has had at least one episode of low blood pressure with SBP in the 70s a few weeks ago, at which time she felt her heart racing (not erratic), though this improved with elevation of her legs. She has no complaints of chest pain, SOB, DOE, LE edema, palpitations outside of low blood pressures, orthopnea, or PND. 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 Evette Georges) who has memory issues following complications from an aneurysm. She continues to practice COVID-19 precautions.   I was able to review her zio patch monitor from 01/2020 which showed sinus rhythm with HR rage 48 bpm-126 bpm (avg 67 bpm) with 1  6-beat run of NSVT.     Past Medical History:  Diagnosis Date  . Arthritis   . Cancer [facility])    Breast cancer  . Colon polyp   . COPD (chronic obstructive pulmonary disease) ([place])   . Depression   . Diabetes mellitus without complication [facility])     Past Surgical History:  Procedure Laterality Date  . ABDOMINAL HYSTERECTOMY    . ANTERIOR CERVICAL DISCECTOMY    . APPENDECTOMY    . BREAST LUMPECTOMY    . double mastectomy    . PARATHYROIDECTOMY    . POSTERIOR CERVICAL FUSION/FORAMINOTOMY    . surgical breast biopsy    . TONSILLECTOMY       Current Outpatient Medications  Medication Sig Dispense Refill  . albuterol (PROVENTIL HFA) 108 (90 Base) MCG/ACT inhaler Inhale 2 puffs every 6 (six) hours as needed into the lungs for wheezing or shortness of breath. 1 Inhaler 11  . budesonide-formoterol (SYMBICORT) 80-4.5 MCG/ACT inhaler Inhale 2 puffs as directed into the lungs. 1 Inhaler 11  . Cholecalciferol (VITAMIN D) 2000 UNITS tablet Take 2,000 Units by mouth daily.    . citalopram (CELEXA) 20 MG tablet Take 30 mg by mouth daily. 1 1/2 Tablet Daily    . eszopiclone (LUNESTA) 2 MG TABS tablet     . furosemide (LASIX) 40 MG tablet Take 1 tablet (40 mg total) by mouth daily as needed for edema. 30 tablet 2  . Magnesium  400 MG CAPS Take 400 mg by mouth.    . metFORMIN (GLUCOPHAGE) 500 MG tablet Take 1 tablet (500 mg total) by mouth 2 (two) times daily with a meal. 60 tablet 2  . potassium chloride (K-DUR) 10 MEQ tablet Take 1 tablet (10 mEq total) by mouth daily. (Patient taking differently: Take 10 mEq by mouth daily. Only when she takes lasix) 30 tablet 2  . rosuvastatin (CRESTOR) 10 MG tablet TAKE ONE TABLET BY MOUTH DAILY 90 tablet 2   No current facility-administered medications for this visit.    Allergies:   Abilify [aripiprazole], Ambien [zolpidem tartrate], and Gabapentin    Social History:  The patient  reports that she quit smoking about 19 years ago. Her smoking use  included cigarettes. She has a 7.00 pack-year smoking history. She has never used smokeless tobacco. She reports that she does not drink alcohol and does not use drugs.   Family History:  The patient's family history includes Asthma in her daughter, maternal grandfather, mother, and sister; Diabetes in her father and paternal grandfather; Healthy in her maternal grandfather, maternal grandmother, and paternal grandfather; Heart disease in her father; Multiple myeloma in her mother; Stomach cancer in her paternal grandmother.    ROS:  Please see the history of present illness.   Otherwise, review of systems are positive for none.   All other systems are reviewed and negative.    PHYSICAL EXAM: VS:  BP (!) 99/59 (BP Location: Left Arm, Patient Position: Sitting, Cuff Size: Small)   Pulse 66   Ht 5' 6" (1.676 m)   Wt 144 lb (65.3 kg)   SpO2 96%   BMI 23.24 kg/m  , BMI Body mass index is 23.24 kg/m. GEN: Well nourished, well developed, in no acute distress HEENT: sclera anicteric Neck: no JVD, carotid bruits, or masses Cardiac: RRR; no murmurs, rubs, or gallops, no edema  Respiratory:  clear to auscultation bilaterally, normal work of breathing GI: soft, nontender, nondistended, + BS MS: no deformity or atrophy Skin: warm and dry, no rash Neuro:  Strength and sensation are intact Psych: euthymic mood, full affect   EKG:  EKG is not ordered today.    Recent Labs: No results found for requested labs within last 8760 hours.    Lipid Panel    Component Value Date/Time   CHOL 129 11/14/2016 0825   TRIG 77 11/14/2016 0825   HDL 58 11/14/2016 0825   CHOLHDL 2.2 11/14/2016 0825   VLDL 15 11/14/2016 0825   LDLCALC 56 11/14/2016 0825      Wt Readings from Last 3 Encounters:  05/31/20 144 lb (65.3 kg)  04/24/20 144 lb (65.3 kg)  01/27/20 148 lb 12.8 oz (67.5 kg)      Other studies Reviewed: Additional studies/ records that were reviewed today include:   Echocardiogram  05/22/20: 1. Left ventricular ejection fraction, by estimation, is 60 to 65%. The  left ventricle has normal function. The left ventricle has no regional  wall motion abnormalities. Left ventricular diastolic parameters are  indeterminate.  2. Right ventricular systolic function is normal. The right ventricular  size is normal. There is normal pulmonary artery systolic pressure.  3. The mitral valve is normal in structure. Trivial mitral valve  regurgitation. No evidence of mitral stenosis.  4. The aortic valve was not well visualized. Aortic valve regurgitation  is not visualized. No aortic stenosis is present.  5. The inferior vena cava is normal in size with greater than 50%  respiratory  variability, suggesting right atrial pressure of 3 mmHg.     ASSESSMENT AND PLAN:  1. Syncope/palpitations: No recurrent episodes over the past month. SBP has ranged from 90s-100s with one episode of SBP in the 70s a few weeks ago since stopping losartan. Echo reassuring. No complaints of palpitations outside of isolated low blood pressure reading - Will stop metoprolol at this time.  - If symptoms of palpitations/lightheadedness increase despite stopping medications, may be worthwhile repeating a cardiac monitor as she does not recall having any symptoms while wearing the 7 day zio patch monitor.  - Can continue metoprolol for now  2. HLD: No recent lipids on file - Continue crestor  3. DM type 2: No recent A1C on file - Continue metformin per PCP   Current medicines are reviewed at length with the patient today.  The patient does not have concerns regarding medicines.  The following changes have been made:  As above  Labs/ tests ordered today include:  No orders of the defined types were placed in this encounter.    Disposition:   FU with Dr. Claiborne Billings in 6 months  Signed, Abigail Butts, PA-C  05/31/2020 10:37 AM

## 2020-05-31 ENCOUNTER — Other Ambulatory Visit: Payer: Self-pay

## 2020-05-31 ENCOUNTER — Ambulatory Visit: Payer: PPO | Admitting: Medical

## 2020-05-31 ENCOUNTER — Encounter: Payer: Self-pay | Admitting: Medical

## 2020-05-31 VITALS — BP 99/59 | HR 66 | Ht 66.0 in | Wt 144.0 lb

## 2020-05-31 DIAGNOSIS — E119 Type 2 diabetes mellitus without complications: Secondary | ICD-10-CM | POA: Diagnosis not present

## 2020-05-31 DIAGNOSIS — E785 Hyperlipidemia, unspecified: Secondary | ICD-10-CM

## 2020-05-31 DIAGNOSIS — R55 Syncope and collapse: Secondary | ICD-10-CM | POA: Diagnosis not present

## 2020-05-31 DIAGNOSIS — R002 Palpitations: Secondary | ICD-10-CM

## 2020-05-31 NOTE — Patient Instructions (Addendum)
Other Instructions  Wear compression stockings as needed  Contact our office if you continue to have increased palpitations and/or light headedness  Medication Instructions:   STOP Metoprolol Succinate (Lopressor)  *If you need a refill on your cardiac medications before your next appointment, please call your pharmacy*  Lab Work: NONE ordered at this time of appointment   If you have labs (blood work) drawn today and your tests are completely normal, you will receive your results only by: Marland Kitchen MyChart Message (if you have MyChart) OR . A paper copy in the mail If you have any lab test that is abnormal or we need to change your treatment, we will call you to review the results.  Testing/Procedures: NONE ordered at this time of appointment   Follow-Up: At [facility], you and your health needs are our priority.  As part of our continuing mission to provide you with exceptional heart care, we have created designated Provider Care Teams.  These Care Teams include your primary Cardiologist (physician) and Advanced Practice Providers (APPs -  Physician Assistants and Nurse Practitioners) who all work together to provide you with the care you need, when you need it.  Your next appointment:   6 month(s)  The format for your next appointment:   In Person  Provider:   Nicki Guadalajara, MD

## 2020-06-27 DIAGNOSIS — Z780 Asymptomatic menopausal state: Secondary | ICD-10-CM | POA: Diagnosis not present

## 2020-06-29 DIAGNOSIS — Z9013 Acquired absence of bilateral breasts and nipples: Secondary | ICD-10-CM | POA: Diagnosis not present

## 2020-06-29 DIAGNOSIS — M8580 Other specified disorders of bone density and structure, unspecified site: Secondary | ICD-10-CM | POA: Diagnosis not present

## 2020-06-29 DIAGNOSIS — Z1212 Encounter for screening for malignant neoplasm of rectum: Secondary | ICD-10-CM | POA: Diagnosis not present

## 2020-06-29 DIAGNOSIS — Z01419 Encounter for gynecological examination (general) (routine) without abnormal findings: Secondary | ICD-10-CM | POA: Diagnosis not present

## 2020-08-10 MED ORDER — LOSARTAN POTASSIUM 25 MG PO TABS: 25.0000 mg | ORAL_TABLET | Freq: Every day | ORAL | 3 refills | Status: DC

## 2020-08-10 MED ORDER — METOPROLOL SUCCINATE ER 25 MG PO TB24: 12.5000 mg | ORAL_TABLET | Freq: Every day | ORAL | 3 refills | Status: DC

## 2020-10-12 DIAGNOSIS — E1122 Type 2 diabetes mellitus with diabetic chronic kidney disease: Secondary | ICD-10-CM | POA: Diagnosis not present

## 2020-10-12 DIAGNOSIS — I129 Hypertensive chronic kidney disease with stage 1 through stage 4 chronic kidney disease, or unspecified chronic kidney disease: Secondary | ICD-10-CM | POA: Diagnosis not present

## 2020-10-12 DIAGNOSIS — Z79899 Other long term (current) drug therapy: Secondary | ICD-10-CM | POA: Diagnosis not present

## 2020-10-12 DIAGNOSIS — E785 Hyperlipidemia, unspecified: Secondary | ICD-10-CM | POA: Diagnosis not present

## 2020-10-12 DIAGNOSIS — E559 Vitamin D deficiency, unspecified: Secondary | ICD-10-CM | POA: Diagnosis not present

## 2020-11-15 ENCOUNTER — Encounter: Payer: Self-pay | Admitting: Cardiovascular Disease

## 2020-11-15 ENCOUNTER — Ambulatory Visit: Payer: PPO | Admitting: Cardiovascular Disease

## 2020-11-15 ENCOUNTER — Other Ambulatory Visit: Payer: Self-pay

## 2020-11-15 DIAGNOSIS — E785 Hyperlipidemia, unspecified: Secondary | ICD-10-CM | POA: Diagnosis not present

## 2020-11-15 DIAGNOSIS — J449 Chronic obstructive pulmonary disease, unspecified: Secondary | ICD-10-CM

## 2020-11-15 DIAGNOSIS — F419 Anxiety disorder, unspecified: Secondary | ICD-10-CM | POA: Diagnosis not present

## 2020-11-15 DIAGNOSIS — F32A Depression, unspecified: Secondary | ICD-10-CM

## 2020-11-15 DIAGNOSIS — I10 Essential (primary) hypertension: Secondary | ICD-10-CM

## 2020-11-15 NOTE — Progress Notes (Signed)
Cardiology Office Note    Date:  11/22/2020   ID:  Lorraine Miller, Lorraine Miller 03-22-1946, MRN [id number]  PCP:  Deland Pretty, MD  Cardiologist:  Shelva Majestic, MD     History of Present Illness:  Lorraine Miller is a 75 y.o. female who established cardiology care with me in January 2018. I last saw her in August 2021.  She presents for a  follow-upcardiology evaluation  Lorraine Miller has a history of diabetes mellitus, COPD, hyperlipidemia, as well as depression.  Over10 years ago, she had undergone a stress test by Dr. Kerrin Mo at the [facility] in [place] which was abnormal and ultimately had a heart catheterization by Dr.[name]..  She was told that her coronaries were normal at that time.  She has a history of breast CA in situ and underwent double mastectomy with subsequent breast reconstructive surgery.  On 05/01/2016 was she was cutting her husband's hair.  She became very lightheaded and presyncopal.  Reportedly her husband had taken her blood pressure which was very low.  By the time EMS arrived, the patient states EMS blood pressure recording was 90/55.  She presented to [facility] hospital and on presentation her blood pressure had risen to 115/52.  CT scan did not reveal any acute abnormality.  She had mild left frontal sinus disease.  A chest x-ray showed COPD with mild bibasal atelectasis glass scarring.  Her ECG was unremarkable.  Troponins were negative.  She again had another short-lived episode of presyncope on New Year's Day, which ultimately resolved.  She is uncertain if she was dehydrated on both of these occasions.  She denied associated chest tightness.  She saw me initially in January 2018 after she had seen on the [facility] record that she may have  mild renal failure and heart failure.  The patient states she was never told this and presented for cardiologic evaluation.  I reassured her after reviewing the [facility] record laboratory that she did not have  renal failure, but actually had stage II renal staging.  I scheduled her for 2-D echo Doppler study which was done 03/15/2017 and showed an EF of 55-60%.  There was mild grade 1 diastolic dysfunction with aortic valve sclerosis.  She underwent follow-up laboratory on February 1 18, which showed a creatinine of 0.83.  Lipid studies were notable for total cholesterol 161, HDL 47, LDL 85, and triglycerides 147.    I recommended that she discontinue lovastatin and changed rosuvastatin 10 mg for more aggressive lipid-lowering.  Subsequent laboratory in October 2018 showed improvement in LDL cholesterol down to 54.  When I last saw her in March 2019 she was continued to do well from a cardiac standpoint.  She has continued to feel well from a heart standpoint.  Her blood pressure has always been normal.  She has had issues with left leg discomfort.  S  She has had issues remotely of cervical neck disease but does not recall being evaluated for lumbar sacral disease.  She also at times notes that her fingers get cold.  There is remote tobacco history but she quit smoking in 2003.  He does not require being cold temperatures for this sensation.    She was evaluated in a telemedicine visit by Fabian Sharp in June 2020 and at that time remained cardiac stable.  I last saw her in August 2021 at which time she continued to do well.  She specifically denied chest pain or shortness of breath.  She does experience a rare palpitation which typically are stress mediated.  At times she does have considerable stress with her husband who has some heart failure issues.  She is followed by Dr. Loney Loh and he recently increased her Celexa dose.  She also was having some sleep issues and she is now sleeping well with the addition of Lunesta 2 mg.  She underwent repeat laboratory in April 2021 by Dr. Loney Loh.  Renal function was excellent with a creatinine of 0.85.  LFTs were normal.  Hemoglobin A1c was 6.2.  LDL cholesterol was 49.     Since I last saw her she has been evaluated by Roby Lofts with her last evaluation in November 2021.  At that time she presented for evaluation of a syncopal spell.  Her blood pressure was low.  That time losartan was discontinued. She underwent an echo Doppler study in December 2021 which showed normal systolic function with EF at 60 to 65%.  There was no significant valvular abnormality.  Presently, he has felt well.  Apparently, she has been taking metoprolol succinate 12.5 mg daily, rosuvastatin 10 mg, furosemide 40 mg as needed depending upon edema.  She is followed by Dr. Shelia Media who checks laboratory.  Her blood pressure has been stable.  She denies any further episodes of presyncope or syncope.  She denies any chest pain.  She presents for evaluation  Past Medical History:  Diagnosis Date   Arthritis    Cancer ([place])    Breast cancer   Colon polyp    COPD (chronic obstructive pulmonary disease) ([place])    Depression    Diabetes mellitus without complication ([place])     Past Surgical History:  Procedure Laterality Date   ABDOMINAL HYSTERECTOMY     ANTERIOR CERVICAL DISCECTOMY     APPENDECTOMY     BREAST LUMPECTOMY     double mastectomy     PARATHYROIDECTOMY     POSTERIOR CERVICAL FUSION/FORAMINOTOMY     surgical breast biopsy     TONSILLECTOMY      Current Medications: Outpatient Medications Prior to Visit  Medication Sig Dispense Refill   albuterol (PROVENTIL HFA) 108 (90 Base) MCG/ACT inhaler Inhale 2 puffs every 6 (six) hours as needed into the lungs for wheezing or shortness of breath. 1 Inhaler 11   budesonide-formoterol (SYMBICORT) 80-4.5 MCG/ACT inhaler Inhale 2 puffs as directed into the lungs. 1 Inhaler 11   Cholecalciferol (VITAMIN D) 2000 UNITS tablet Take 2,000 Units by mouth daily.     citalopram (CELEXA) 20 MG tablet Take 30 mg by mouth daily. 1 1/2 Tablet Daily     eszopiclone (LUNESTA) 2 MG TABS tablet      furosemide (LASIX) 40 MG tablet Take 1 tablet (40  mg total) by mouth daily as needed for edema. 30 tablet 2   losartan (COZAAR) 25 MG tablet Take 12.5 mg by mouth daily. Take 1/2 (12.82m) tablet by mouth daily     Magnesium 400 MG CAPS Take 400 mg by mouth.     metFORMIN (GLUCOPHAGE) 500 MG tablet Take 1 tablet (500 mg total) by mouth 2 (two) times daily with a meal. 60 tablet 2   metoprolol succinate (TOPROL-XL) 25 MG 24 hr tablet Take 0.5 tablets (12.5 mg total) by mouth daily. 45 tablet 3   potassium chloride (K-DUR) 10 MEQ tablet Take 1 tablet (10 mEq total) by mouth daily. (Patient taking differently: Take 10 mEq by mouth daily. Only when she takes lasix) 30 tablet 2  rosuvastatin (CRESTOR) 10 MG tablet TAKE ONE TABLET BY MOUTH DAILY 90 tablet 2   losartan (COZAAR) 25 MG tablet Take 1 tablet (25 mg total) by mouth daily. (Patient taking differently: No sig reported) 90 tablet 3   No facility-administered medications prior to visit.     Allergies:   Abilify [aripiprazole], Ambien [zolpidem tartrate], Ambien [zolpidem], Gabapentin, and Trazodone hcl   Social History   Socioeconomic History   Marital status: Married    Spouse name: Not on file   Number of children: 2   Years of education: 14   Highest education level: Not on file  Occupational History   Not on file  Tobacco Use   Smoking status: Former    Packs/day: 0.25    Years: 28.00    Pack years: 7.00    Types: Cigarettes    Quit date: 06/03/2001    Years since quitting: 19.4   Smokeless tobacco: Never  Substance and Sexual Activity   Alcohol use: No    Alcohol/week: 0.0 standard drinks   Drug use: No   Sexual activity: Not on file  Other Topics Concern   Not on file  Social History Narrative   Fun: Paint and crafts   Denies religious beliefs effecting health care.   Denies abuse and feels safe at home.    Social Determinants of Health   Financial Resource Strain: Not on file  Food Insecurity: Not on file  Transportation Needs: Not on file  Physical Activity:  Not on file  Stress: Not on file  Social Connections: Not on file     Family History:  The patient's family history includes Asthma in her daughter, maternal grandfather, mother, and sister; Diabetes in her father and paternal grandfather; Healthy in her maternal grandfather, maternal grandmother, and paternal grandfather; Heart disease in her father; Multiple myeloma in her mother; Stomach cancer in her paternal grandmother.   ROS General: Negative; No fevers, chills, or night sweats;  HEENT: Negative; No changes in vision or hearing, sinus congestion, difficulty swallowing Pulmonary: Negative; No cough, wheezing, shortness of breath, hemoptysis Cardiovascular: See history of present illness GI: Negative; No nausea, vomiting, diarrhea, or abdominal pain GU: Negative; No dysuria, hematuria, or difficulty voiding Musculoskeletal: Negative; no myalgias, joint pain, or weakness Hematologic/Oncology: Negative; no easy bruising, bleeding Endocrine: Positive for diabetes mellitus Neuro: Negative; no changes in balance, headaches Skin: Negative; No rashes or skin lesions Psychiatric: Positive for increased stress, improved, remote history of depression Sleep: Negative; No snoring, daytime sleepiness, hypersomnolence, bruxism, restless legs, hypnogognic hallucinations, no cataplexy Other comprehensive 14 point system review is negative.   PHYSICAL EXAM:   VS:  BP 110/60 (BP Location: Right Arm)   Pulse 64   Ht '5\' 6"'  (1.676 m)   Wt 141 lb (64 kg)   BMI 22.76 kg/m     Blood pressure by me was 116/60 supine and 108/60 standing  Wt Readings from Last 3 Encounters:  11/15/20 141 lb (64 kg)  05/31/20 144 lb (65.3 kg)  04/24/20 144 lb (65.3 kg)    General: Alert, oriented, no distress.  Skin: normal turgor, no rashes, warm and dry HEENT: Normocephalic, atraumatic. Pupils equal round and reactive to light; sclera anicteric; extraocular muscles intact;  Nose without nasal septal  hypertrophy Mouth/Parynx benign; Mallinpatti scale 3 Neck: No JVD, no carotid bruits; normal carotid upstroke Lungs: clear to ausculatation and percussion; no wheezing or rales Chest wall: without tenderness to palpitation Heart: PMI not displaced, RRR, s1 s2 normal, 1/6  systolic murmur, no diastolic murmur, no rubs, gallops, thrills, or heaves Abdomen: soft, nontender; no hepatosplenomehaly, BS+; abdominal aorta nontender and not dilated by palpation. Back: no CVA tenderness Pulses 2+ Musculoskeletal: full range of motion, normal strength, no joint deformities Extremities: no clubbing cyanosis or edema, Homan's sign negative  Neurologic: grossly nonfocal; Cranial nerves grossly wnl Psychologic: Normal mood and affect   Studies/Labs Reviewed:   ECG (independently read by me): Normal sinus rhythm at 64 bpm, normal intervals, no ST segment changes.  No ectopy.  August 2021 ECG (independently read by me): Normal sinus rhythm at 72 bpm; no ST changes, normal intervals  March 2019 ECG (independently read by me): sinus rhythm at 66 bpm.  No ectopy.  Normal intervals.  No ST segment changes.  October 2018 ECG (independently read by me): normal sinus rhythm at 80 bpm.  Normal intervals.  No ectopy.  March 2018 ECG (independently read by me): Normal sinus rhythm at 81 bpm.  Normal intervals.  No ectopy.  No ST segment changes.  January 2018 EKG:  EKG is ordered today.  ECG (independently read by me): Normal sinus rhythm at 77 bpm.  No significant ST-T changes.  PR interval 114 ms; QRS duration 84 ms; QTc interval normal at 411 milliseconds.  Recent Labs: BMP Latest Ref Rng & Units 11/14/2016 07/04/2016 05/01/2016  Glucose 65 - 99 mg/dL 116(H) 117(H) 110(H)  BUN 7 - 25 mg/dL '18 19 10  ' Creatinine 0.60 - 0.93 mg/dL 0.84 0.83 0.77  Sodium 135 - 146 mmol/L 139 140 141  Potassium 3.5 - 5.3 mmol/L 4.6 4.3 3.8  Chloride 98 - 110 mmol/L 103 104 105  CO2 20 - 31 mmol/L '27 28 27  ' Calcium 8.6 - 10.4  mg/dL 9.3 8.9 9.4     Hepatic Function Latest Ref Rng & Units 12/16/2016 11/14/2016 07/04/2016  Total Protein 6.0 - 8.5 g/dL 6.2 6.2 6.3  Albumin 3.5 - 4.8 g/dL 4.7 4.3 4.1  AST 0 - 40 IU/L '25 28 18  ' ALT 0 - 32 IU/L 30 42(H) 24  Alk Phosphatase 39 - 117 IU/L 61 60 55  Total Bilirubin 0.0 - 1.2 mg/dL 0.9 1.2 1.6(H)  Bilirubin, Direct 0.00 - 0.40 mg/dL 0.24 - -    CBC Latest Ref Rng & Units 07/04/2016 05/01/2016 12/20/2013  WBC 3.8 - 10.8 K/uL 6.1 10.5 8.7  Hemoglobin 11.7 - 15.5 g/dL 13.9 14.1 13.9  Hematocrit 35.0 - 45.0 % 40.7 41.5 42.3  Platelets 140 - 400 K/uL 343 330 320   Lab Results  Component Value Date   MCV 87.5 07/04/2016   MCV 87.7 05/01/2016   MCV 91 12/20/2013   Lab Results  Component Value Date   TSH 3.35 07/04/2016   Lab Results  Component Value Date   HGBA1C 6.0 (H) 07/04/2016     BNP No results found for: BNP  ProBNP No results found for: PROBNP   Lipid Panel     Component Value Date/Time   CHOL 129 11/14/2016 0825   TRIG 77 11/14/2016 0825   HDL 58 11/14/2016 0825   CHOLHDL 2.2 11/14/2016 0825   VLDL 15 11/14/2016 0825   LDLCALC 56 11/14/2016 0825     RADIOLOGY: No results found.   Additional studies/ records that were reviewed today include:  I reviewed the emergency room records as well as records from [facility], recent labs and echo.   ECHO: 05/22/2020 IMPRESSIONS   1. Left ventricular ejection fraction, by estimation, is 60 to  65%. The  left ventricle has normal function. The left ventricle has no regional  wall motion abnormalities. Left ventricular diastolic parameters are  indeterminate.   2. Right ventricular systolic function is normal. The right ventricular  size is normal. There is normal pulmonary artery systolic pressure.   3. The mitral valve is normal in structure. Trivial mitral valve  regurgitation. No evidence of mitral stenosis.   4. The aortic valve was not well visualized. Aortic valve  regurgitation  is not visualized. No aortic stenosis is present.   5. The inferior vena cava is normal in size with greater than 50%  respiratory variability, suggesting right atrial pressure of 3 mmHg.   ASSESSMENT:    1. Essential hypertension   2. Hyperlipidemia with target LDL less than 70   3. COPD mixed type ([place])   4. Anxiety and depression     PLAN:  Lorraine Miller is a 76 year old female who has a history of type 2 diabetes mellitus, hypertension, hyperlipidemia, and COPD.   A nuclear stress test done over 10 years ago raised concern for ischemia and definitive cardiac catheterization done in [place] revealed normal coronary arteries.  She had experienced 2 episodes of presyncope while standing.  When I initially saw her, she was not orthostatic.  I reduced her diuretic regimen.  She did not have renal failure or CHF.  Her echo Doppler study of February 2018 revealed normal systolic function although there was evidence for mild grade 1 diastolic dysfunction. Carotid studies revealed mild plaque bilaterally, less than 39%.  When I last saw her in August 2021 she was on losartan 25 mg in addition to metoprolol succinate 12.5 mg daily.  Since her evaluation, she apparently had a near syncopal spell associated with hypotension at which time she was seen in November 2021 by Roby Lofts and with low blood pressure it was recommended to discontinue losartan.  Her blood pressure today is stable without orthostatic change at 116/60 supine and 108/60 standing when taken by me.  She continues to be on metoprolol succinate 12.5 mg.  Her ECG shows sinus rhythm at 64 without ectopy.  He is tolerating rosuvastatin for hyperlipidemia.  Dr. Shelia Media checks her laboratory.  Continues to be on Symbicort and as needed albuterol for house COPD.  She is on Celexa for mild depression.  She has a follow-up visit with Dr. Shelia Media in October.  I will see her in 6 months for cardiology reevaluation.   Medication  Adjustments/Labs and Tests Ordered: Current medicines are reviewed at length with the patient today.  Concerns regarding medicines are outlined above.  Medication changes, Labs and Tests ordered today are listed in the Patient Instructions below. Patient Instructions  Medication Instructions:  Your physician recommends that you continue on your current medications as directed. Please refer to the Current Medication list given to you today.  *If you need a refill on your cardiac medications before your next appointment, please call your pharmacy*   Lab Work: None ordered.     Testing/Procedures: None ordered.    Follow-Up: At [facility], you and your health needs are our priority.  As part of our continuing mission to provide you with exceptional heart care, we have created designated Provider Care Teams.  These Care Teams include your primary Cardiologist (physician) and Advanced Practice Providers (APPs -  Physician Assistants and Nurse Practitioners) who all work together to provide you with the care you need, when you need it.  We recommend signing up  for the patient portal called "MyChart".  Sign up information is provided on this After Visit Summary.  MyChart is used to connect with patients for Virtual Visits (Telemedicine).  Patients are able to view lab/test results, encounter notes, upcoming appointments, etc.  Non-urgent messages can be sent to your provider as well.   To learn more about what you can do with MyChart, go to [email or web address].    Your next appointment:   6 month(s)  The format for your next appointment:   In Person  Provider:   Shelva Majestic, MD   Other Instructions You med list has been updated.    Signed, Shelva Majestic, MD , [facility] 11/22/2020 4:43 PM    [facility] [address], [place], [place], [place]  [address] Phone: [phone]

## 2020-11-15 NOTE — Patient Instructions (Signed)
Medication Instructions:  Your physician recommends that you continue on your current medications as directed. Please refer to the Current Medication list given to you today.  *If you need a refill on your cardiac medications before your next appointment, please call your pharmacy*   Lab Work: None ordered.     Testing/Procedures: None ordered.    Follow-Up: At [facility], you and your health needs are our priority.  As part of our continuing mission to provide you with exceptional heart care, we have created designated Provider Care Teams.  These Care Teams include your primary Cardiologist (physician) and Advanced Practice Providers (APPs -  Physician Assistants and Nurse Practitioners) who all work together to provide you with the care you need, when you need it.  We recommend signing up for the patient portal called "MyChart".  Sign up information is provided on this After Visit Summary.  MyChart is used to connect with patients for Virtual Visits (Telemedicine).  Patients are able to view lab/test results, encounter notes, upcoming appointments, etc.  Non-urgent messages can be sent to your provider as well.   To learn more about what you can do with MyChart, go to [email or web address].    Your next appointment:   6 month(s)  The format for your next appointment:   In Person  Provider:   Shelva Majestic, MD   Other Instructions You med list has been updated.

## 2020-11-22 ENCOUNTER — Encounter: Payer: Self-pay | Admitting: Cardiovascular Disease

## 2020-12-26 DIAGNOSIS — E119 Type 2 diabetes mellitus without complications: Secondary | ICD-10-CM | POA: Diagnosis not present

## 2021-03-29 DIAGNOSIS — I129 Hypertensive chronic kidney disease with stage 1 through stage 4 chronic kidney disease, or unspecified chronic kidney disease: Secondary | ICD-10-CM | POA: Diagnosis not present

## 2021-03-29 DIAGNOSIS — E1122 Type 2 diabetes mellitus with diabetic chronic kidney disease: Secondary | ICD-10-CM | POA: Diagnosis not present

## 2021-03-29 DIAGNOSIS — E785 Hyperlipidemia, unspecified: Secondary | ICD-10-CM | POA: Diagnosis not present

## 2021-04-03 DIAGNOSIS — M8580 Other specified disorders of bone density and structure, unspecified site: Secondary | ICD-10-CM | POA: Diagnosis not present

## 2021-04-03 DIAGNOSIS — E785 Hyperlipidemia, unspecified: Secondary | ICD-10-CM | POA: Diagnosis not present

## 2021-04-03 DIAGNOSIS — E1122 Type 2 diabetes mellitus with diabetic chronic kidney disease: Secondary | ICD-10-CM | POA: Diagnosis not present

## 2021-04-03 DIAGNOSIS — F339 Major depressive disorder, recurrent, unspecified: Secondary | ICD-10-CM | POA: Diagnosis not present

## 2021-04-03 DIAGNOSIS — I6523 Occlusion and stenosis of bilateral carotid arteries: Secondary | ICD-10-CM | POA: Diagnosis not present

## 2021-04-03 DIAGNOSIS — J449 Chronic obstructive pulmonary disease, unspecified: Secondary | ICD-10-CM | POA: Diagnosis not present

## 2021-04-03 DIAGNOSIS — I129 Hypertensive chronic kidney disease with stage 1 through stage 4 chronic kidney disease, or unspecified chronic kidney disease: Secondary | ICD-10-CM | POA: Diagnosis not present

## 2021-04-03 DIAGNOSIS — Z0000 Encounter for general adult medical examination without abnormal findings: Secondary | ICD-10-CM | POA: Diagnosis not present

## 2021-04-03 DIAGNOSIS — I519 Heart disease, unspecified: Secondary | ICD-10-CM | POA: Diagnosis not present

## 2021-04-17 ENCOUNTER — Other Ambulatory Visit: Payer: Self-pay | Admitting: Cardiovascular Disease

## 2021-05-10 DIAGNOSIS — F339 Major depressive disorder, recurrent, unspecified: Secondary | ICD-10-CM | POA: Diagnosis not present

## 2021-06-25 ENCOUNTER — Ambulatory Visit: Payer: PPO | Admitting: Cardiovascular Disease

## 2021-06-25 ENCOUNTER — Encounter: Payer: Self-pay | Admitting: Cardiovascular Disease

## 2021-06-25 ENCOUNTER — Other Ambulatory Visit: Payer: Self-pay

## 2021-06-25 VITALS — BP 112/60 | HR 61 | Ht 66.0 in | Wt 148.2 lb

## 2021-06-25 DIAGNOSIS — E785 Hyperlipidemia, unspecified: Secondary | ICD-10-CM | POA: Diagnosis not present

## 2021-06-25 DIAGNOSIS — J449 Chronic obstructive pulmonary disease, unspecified: Secondary | ICD-10-CM | POA: Diagnosis not present

## 2021-06-25 DIAGNOSIS — I10 Essential (primary) hypertension: Secondary | ICD-10-CM

## 2021-06-25 DIAGNOSIS — R002 Palpitations: Secondary | ICD-10-CM

## 2021-06-25 DIAGNOSIS — F419 Anxiety disorder, unspecified: Secondary | ICD-10-CM

## 2021-06-25 DIAGNOSIS — F32A Depression, unspecified: Secondary | ICD-10-CM

## 2021-06-25 NOTE — Progress Notes (Signed)
Cardiology Office Note    Date:  07/02/2021   ID:  Lorraine Miller, Lorraine Miller [birth year], MRN [id number]  PCP:  Deland Pretty, MD  Cardiologist:  Shelva Majestic, MD    History of Present Illness:  Lorraine Miller is a 76 y.o. female who established cardiology care with me in January 2018. I last saw her in June 2022.   She presents for a 7 month follow-up cardiology evaluation  Lorraine Miller has a history of diabetes mellitus, COPD, hyperlipidemia, as well as depression.  Over10 years ago, she had undergone a stress test by Dr. Kerrin Mo at [facility] in [place] which was abnormal and ultimately had a heart catheterization by Dr.[name]..  She was told that her coronaries were normal at that time.  She has a history of breast CA in situ and underwent double mastectomy with subsequent breast reconstructive surgery.  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  Reportedly her husband took her blood pressure which was very low.  By Lorraine time EMS arrived, Lorraine Miller states EMS blood pressure recording was 90/55.  She presented to [facility] hospital and on presentation her blood pressure had risen to 115/52.  CT scan did not reveal any acute abnormality.  She had mild left frontal sinus disease.  A chest x-ray showed COPD with mild bibasal atelectasis glass scarring.  Her ECG was unremarkable.  Troponins were negative.  She again had another short-lived episode of presyncope on New Year's Day, which ultimately resolved.  She is uncertain if she was dehydrated on both of these occasions.  She denied associated chest tightness.  She saw me initially in January 2018 after she had seen on [facility] record that she may have mild renal failure and heart failure.  Lorraine Miller states she was never told this and presented for cardiologic evaluation.  I reassured her after reviewing Lor[place] record laboratory that she did not have  renal failure, but actually had stage II renal staging.  I scheduled her for 2-D echo Doppler study which was done 03/15/2017 and showed an EF of 55-60%.  There was mild grade 1 diastolic dysfunction with aortic valve sclerosis.  She underwent follow-up laboratory on February 1 18, which showed a creatinine of 0.83.  Lipid studies were notable for total cholesterol 161, HDL 47, LDL 85, and triglycerides 147.    I recommended that she discontinue lovastatin and changed rosuvastatin 10 mg for more aggressive lipid-lowering.  Subsequent laboratory in October 2018 showed improvement in LDL cholesterol down to 54.  When I last saw her in March 2019 she was continued to do well from a cardiac standpoint.  She has continued to feel well from a heart standpoint.  Her blood pressure has always been normal.  She has had issues with left leg discomfort.  S  She has had issues remotely of cervical neck disease but does not recall being evaluated for lumbar sacral disease.  She also at times notes that her fingers get cold.  There is remote tobacco history but she quit smoking in 2003.  He does not require being cold temperatures for this sensation.    She was evaluated in a telemedicine visit by Fabian Sharp in June 2020 and at that time remained cardiac stable.  I saw her in August 2021 at which time she continued to do well.  She specifically denied chest pain or shortness of breath.  She does experience a rare palpitation which typically are stress mediated.  At times she does have considerable stress with her husband who has some heart failure issues.  She is followed by Dr. Loney Loh and he recently increased her Celexa dose.  She also was having some sleep issues and she is now sleeping well with Lorraine addition of Lunesta 2 mg.  She underwent repeat laboratory in April 2021 by Dr. Loney Loh.  Renal function was excellent with a creatinine of 0.85.  LFTs were normal.  Hemoglobin A1c was 6.2.  LDL cholesterol was 49.    Since  I last saw her she has been evaluated by Roby Lofts with her last evaluation in November 2021.  At that time she presented for evaluation of a syncopal spell.  Her blood pressure was low.  That time losartan was discontinued. She underwent an echo Doppler study in December 2021 which showed normal systolic function with EF at 60 to 65%.  There was no significant valvular abnormality.  I last saw her on November 15, 2020 at which time she felt well.  She was taking metoprolol succinate 12.5 mg daily, rosuvastatin 10 mg, and was taking furosemide 40 mg as needed depending upon edema.  She is followed by Dr. Shelia Media who checks laboratory.  Her blood pressure has been stable.  She denied any further episodes of presyncope or syncope and denied any chest pain.    Since I last saw her, Lorraine Miller has continued to feel well.  She denies chest pain or shortness of breath.  She is unaware of palpitations.  She denies any orthostatic symptomatology.  There has not been any edema and she has rarely taking furosemide.  She continues to be on losartan 12.5 mg daily in addition to metoprolol succinate 12.5 mg.  She continues to be on rosuvastatin 10 mg for hyperlipidemia and LDL cholesterol had been in Lorraine 50s.  She takes Symbicort and as needed albuterol for mild COPD.  She continues to take citalopram for depression.  She presents for reevaluation.   Past Medical History:  Diagnosis Date   Arthritis    Cancer ([place])    Breast cancer   Colon polyp    COPD (chronic obstructive pulmonary disease) ([place])    Depression    Diabetes mellitus without complication ([place])     Past Surgical History:  Procedure Laterality Date   ABDOMINAL HYSTERECTOMY     ANTERIOR CERVICAL DISCECTOMY     APPENDECTOMY     BREAST LUMPECTOMY     double mastectomy     PARATHYROIDECTOMY     POSTERIOR CERVICAL FUSION/FORAMINOTOMY     surgical breast biopsy     TONSILLECTOMY      Current Medications: Outpatient Medications Prior to  Visit  Medication Sig Dispense Refill   albuterol (PROVENTIL HFA) 108 (90 Base) MCG/ACT inhaler Inhale 2 puffs every 6 (six) hours as needed into Lorraine lungs for wheezing or shortness of breath. 1 Inhaler 11   budesonide-formoterol (SYMBICORT) 80-4.5 MCG/ACT inhaler Inhale 2 puffs as directed into Lorraine lungs. 1 Inhaler 11   Cholecalciferol (VITAMIN D) 2000 UNITS tablet Take 2,000 Units by mouth daily.     citalopram (CELEXA) 20 MG tablet Take 30 mg by mouth daily. 1 1/2 Tablet Daily     FLUZONE HIGH-DOSE QUADRIVALENT 0.7 ML SUSY      furosemide (LASIX) 40 MG tablet Take 1 tablet (40 mg total) by mouth daily as needed for edema. 30 tablet 2   losartan (COZAAR)  25 MG tablet Take 12.5 mg by mouth daily. Take 1/2 (12.16m) tablet by mouth daily     Magnesium 400 MG CAPS Take 400 mg by mouth.     metFORMIN (GLUCOPHAGE) 500 MG tablet Take 1 tablet (500 mg total) by mouth 2 (two) times daily with a meal. 60 tablet 2   metoprolol succinate (TOPROL-XL) 25 MG 24 hr tablet TAKE 1/2 TABLET BY MOUTH DAILY 45 tablet 3   mirtazapine (REMERON) 15 MG tablet Take 7.5 mg by mouth daily.     PFIZER COVID-19 VAC BIVALENT injection      rosuvastatin (CRESTOR) 10 MG tablet TAKE ONE TABLET BY MOUTH DAILY 90 tablet 2   eszopiclone (LUNESTA) 2 MG TABS tablet  (Miller not taking: Reported on 06/25/2021)     potassium chloride (K-DUR) 10 MEQ tablet Take 1 tablet (10 mEq total) by mouth daily. (Miller not taking: Reported on 06/25/2021) 30 tablet 2   No facility-administered medications prior to visit.     Allergies:   Abilify [aripiprazole], Ambien [zolpidem tartrate], Ambien [zolpidem], Gabapentin, and Trazodone hcl   Social History   Socioeconomic History   Marital status: Married    Spouse name: Not on file   Number of children: 2   Years of education: 14   Highest education level: Not on file  Occupational History   Not on file  Tobacco Use   Smoking status: Former    Packs/day: 0.25    Years: 28.00    Pack  years: 7.00    Types: Cigarettes    Quit date: 06/03/2001    Years since quitting: 20.0   Smokeless tobacco: Never  Substance and Sexual Activity   Alcohol use: No    Alcohol/week: 0.0 standard drinks   Drug use: No   Sexual activity: Not on file  Other Topics Concern   Not on file  Social History Narrative   Fun: Paint and crafts   Denies religious beliefs effecting health care.   Denies abuse and feels safe at home.    Social Determinants of Health   Financial Resource Strain: Not on file  Food Insecurity: Not on file  Transportation Needs: Not on file  Physical Activity: Not on file  Stress: Not on file  Social Connections: Not on file     Family History:  Lorraine Miller's family history includes Asthma in her daughter, maternal grandfather, mother, and sister; Diabetes in her father and paternal grandfather; Healthy in her maternal grandfather, maternal grandmother, and paternal grandfather; Heart disease in her father; Multiple myeloma in her mother; Stomach cancer in her paternal grandmother.   ROS General: Negative; No fevers, chills, or night sweats;  HEENT: Negative; No changes in vision or hearing, sinus congestion, difficulty swallowing Pulmonary: No recent cough, wheezing, shortness of breath, hemoptysis Cardiovascular: See history of present illness GI: Negative; No nausea, vomiting, diarrhea, or abdominal pain GU: Negative; No dysuria, hematuria, or difficulty voiding Musculoskeletal: Negative; no myalgias, joint pain, or weakness Hematologic/Oncology: Negative; no easy bruising, bleeding Endocrine: Positive for diabetes mellitus Neuro: Negative; no changes in balance, headaches Skin: Negative; No rashes or skin lesions Psychiatric: Positive for increased stress, improved, remote history of depression Sleep: Negative; No snoring, daytime sleepiness, hypersomnolence, bruxism, restless legs, hypnogognic hallucinations, no cataplexy Other comprehensive 14 point system  review is negative.   PHYSICAL EXAM:   VS:  BP 112/60    Pulse 61    Ht 5' 6" (1.676 m)    Wt 148 lb 3.2 oz (67.2 kg)  SpO2 97%    BMI 23.92 kg/m     Repeat blood pressure by me was 112/60 supine and 100/60 standing.  Wt Readings from Last 3 Encounters:  06/25/21 148 lb 3.2 oz (67.2 kg)  11/15/20 141 lb (64 kg)  05/31/20 144 lb (65.3 kg)    General: Alert, oriented, no distress.  Skin: normal turgor, no rashes, warm and dry HEENT: Normocephalic, atraumatic. Pupils equal round and reactive to light; sclera anicteric; extraocular muscles intact;  Nose without nasal septal hypertrophy Mouth/Parynx benign; Mallinpatti scale 3 Neck: No JVD, no carotid bruits; normal carotid upstroke Lungs: clear to ausculatation and percussion; no wheezing or rales Chest wall: without tenderness to palpitation Heart: PMI not displaced, RRR, s1 s2 normal, 1/6 systolic murmur, no diastolic murmur, no rubs, gallops, thrills, or heaves Abdomen: soft, nontender; no hepatosplenomehaly, BS+; abdominal aorta nontender and not dilated by palpation. Back: no CVA tenderness Pulses 2+ Musculoskeletal: full range of motion, normal strength, no joint deformities Extremities: no clubbing cyanosis or edema, Homan's sign negative  Neurologic: grossly nonfocal; Cranial nerves grossly wnl Psychologic: Normal mood and affect     Studies/Labs Reviewed:   June 25, 2021   ECG (independently read by me): NSR at 61; no ectopy  November 15, 2020 ECG (independently read by me): Normal sinus rhythm at 64 bpm, normal intervals, no ST segment changes.  No ectopy.  August 2021 ECG (independently read by me): Normal sinus rhythm at 72 bpm; no ST changes, normal intervals  March 2019 ECG (independently read by me): sinus rhythm at 66 bpm.  No ectopy.  Normal intervals.  No ST segment changes.  October 2018 ECG (independently read by me): normal sinus rhythm at 80 bpm.  Normal intervals.  No ectopy.  March 2018 ECG  (independently read by me): Normal sinus rhythm at 81 bpm.  Normal intervals.  No ectopy.  No ST segment changes.  January 2018 EKG:  EKG is ordered today.  ECG (independently read by me): Normal sinus rhythm at 77 bpm.  No significant ST-T changes.  PR interval 114 ms; QRS duration 84 ms; QTc interval normal at 411 milliseconds.  Recent Labs: BMP Latest Ref Rng & Units 11/14/2016 07/04/2016 05/01/2016  Glucose 65 - 99 mg/dL 116(H) 117(H) 110(H)  BUN 7 - 25 mg/dL _0 Creatinine 0.60 - 0.93 mg/dL 0.84 0.83 0.77  Sodium 135 - 146 mmol/L 139 140 141  Potassium 3.5 - 5.3 mmol/L 4.6 4.3 3.8  Chloride 98 - 110 mmol/L 103 104 105  CO2 20 - 31 mmol/L _1 Calcium 8.6 - 10.4 mg/dL 9.3 8.9 9.4     Hepatic Function Latest Ref Rng & Units 12/16/2016 11/14/2016 07/04/2016  Total Protein 6.0 - 8.5 g/dL 6.2 6.2 6.3  Albumin 3.5 - 4.8 g/dL 4.7 4.3 4.1  AST 0 - 40 IU/L _2 ALT 0 - 32 IU/L 30 42(H) 24  Alk Phosphatase 39 - 117 IU/L 61 60 55  Total Bilirubin 0.0 - 1.2 mg/dL 0.9 1.2 1.6(H)  Bilirubin, Direct 0.00 - 0.40 mg/dL 0.24 - -    CBC Latest Ref Rng & Units 07/04/2016 05/01/2016 12/20/2013  WBC 3.8 - 10.8 K/uL 6.1 10.5 8.7  Hemoglobin 11.7 - 15.5 g/dL 13.9 14.1 13.9  Hematocrit 35.0 - 45.0 % 40.7 41.5 42.3  Platelets 140 - 400 K/uL 343 330 320   Lab Results  Component Value Date   MCV 87.5 07/04/2016   MCV 87.7 05/01/2016  MCV 91 12/20/2013   Lab Results  Component Value Date   TSH 3.35 07/04/2016   Lab Results  Component Value Date   HGBA1C 6.0 (H) 07/04/2016     BNP No results found for: BNP  ProBNP No results found for: PROBNP   Lipid Panel     Component Value Date/Time   CHOL 129 11/14/2016 0825   TRIG 77 11/14/2016 0825   HDL 58 11/14/2016 0825   CHOLHDL 2.2 11/14/2016 0825   VLDL 15 11/14/2016 0825   LDLCALC 56 11/14/2016 0825     RADIOLOGY: No results found.   Additional studies/ records that were reviewed today include:  I reviewed Lorraine  emergency room records as well as records from [facility], recent labs and echo.   ECHO: 05/22/2020 IMPRESSIONS   1. Left ventricular ejection fraction, by estimation, is 60 to 65%. Lorraine  left ventricle has normal function. Lorraine left ventricle has no regional  wall motion abnormalities. Left ventricular diastolic parameters are  indeterminate.   2. Right ventricular systolic function is normal. Lorraine right ventricular  size is normal. There is normal pulmonary artery systolic pressure.   3. Lorraine mitral valve is normal in structure. Trivial mitral valve  regurgitation. No evidence of mitral stenosis.   4. Lorraine aortic valve was not well visualized. Aortic valve regurgitation  is not visualized. No aortic stenosis is present.   5. Lorraine inferior vena cava is normal in size with greater than 50%  respiratory variability, suggesting right atrial pressure of 3 mmHg.   ASSESSMENT:    1. Essential hypertension   2. Hyperlipidemia with target LDL less than 70   3. COPD mixed type ([place])   4. Anxiety and depression     PLAN:  Lorraine Miller is a 76 year old female who has a history of type 2 diabetes mellitus, hypertension, hyperlipidemia, and COPD.   A nuclear stress test done over 12 years ago raised concern for ischemia and definitive cardiac catheterization done in [place] revealed normal coronary arteries.  She had experienced 2 episodes of presyncope while standing.  When I initially saw her, she was not orthostatic.  I reduced her diuretic regimen.  She did not have renal failure or CHF.  Her echo Doppler study of February 2018 revealed normal systolic function although there was evidence for mild grade 1 diastolic dysfunction. Carotid studies revealed mild plaque bilaterally, less than 39%.  When I last saw her in August 2021 she was on losartan 25 mg in addition to metoprolol succinate 12.5 mg daily.  She had a near syncopal spell associated with hypotension at which time she  was seen in November 2021 by Roby Lofts, PA and with low blood pressure it was recommended to discontinue losartan.  Currently presently she is now taking losartan 12.5 mg daily in addition to metoprolol succinate 12.5 mg.  She does not have significant orthostatic symptoms although blood pressure remains somewhat low.  She Deland Pretty has been checking her laboratory.  She cannot continues to be on rosuvastatin for hyperlipidemia and lipid study on March 29, 2021 showed total cholesterol 138 triglycerides 99 HDL 66 and LDL 52.  She continues to take Symbicort and as needed albuterol for COPD.  She is diabetic on metformin 500 mg twice a day.  Presently she is stable.  She will be following up with Dr. Shelia Media.  I will see her in 1 year for reevaluation.   Medication Adjustments/Labs and Tests Ordered: Current medicines are reviewed at  length with Lorraine Miller today.  Concerns regarding medicines are outlined above.  Medication changes, Labs and Tests ordered today are listed in Lorraine Miller Instructions below. Miller Instructions  Medication Instructions:  NO CHANGEES  *If you need a refill on your cardiac medications before your next appointment, please call your pharmacy*   Lab Work: NOT NEEDED     Testing/Procedures:  NOT NEEDED  Follow-Up: At [facility], you and your health needs are our priority.  As part of our continuing mission to provide you with exceptional heart care, we have created designated Provider Care Teams.  These Care Teams include your primary Cardiologist (physician) and Advanced Practice Providers (APPs -  Physician Assistants and Nurse Practitioners) who all work together to provide you with Lorraine care you need, when you need it.     Your next appointment:   12 month(s)  Lorraine format for your next appointment:   In Person  Provider:   Shelva Majestic, MD       Signed, Shelva Majestic, MD , [facility] 07/02/2021 6:30 PM    [facility] [address] [place], [place], [place]  [address] Phone: [phone]

## 2021-06-25 NOTE — Patient Instructions (Signed)
Medication Instructions:  NO CHANGEES  *If you need a refill on your cardiac medications before your next appointment, please call your pharmacy*   Lab Work: NOT NEEDED     Testing/Procedures:  NOT NEEDED  Follow-Up: At [facility], you and your health needs are our priority.  As part of our continuing mission to provide you with exceptional heart care, we have created designated Provider Care Teams.  These Care Teams include your primary Cardiologist (physician) and Advanced Practice Providers (APPs -  Physician Assistants and Nurse Practitioners) who all work together to provide you with the care you need, when you need it.     Your next appointment:   12 month(s)  The format for your next appointment:   In Person  Provider:   Shelva Majestic, MD

## 2021-07-02 ENCOUNTER — Encounter: Payer: Self-pay | Admitting: Cardiovascular Disease

## 2021-07-02 DIAGNOSIS — Z9013 Acquired absence of bilateral breasts and nipples: Secondary | ICD-10-CM | POA: Diagnosis not present

## 2021-07-02 DIAGNOSIS — Z01419 Encounter for gynecological examination (general) (routine) without abnormal findings: Secondary | ICD-10-CM | POA: Diagnosis not present

## 2021-07-02 DIAGNOSIS — Z1212 Encounter for screening for malignant neoplasm of rectum: Secondary | ICD-10-CM | POA: Diagnosis not present

## 2021-07-02 DIAGNOSIS — Z90710 Acquired absence of both cervix and uterus: Secondary | ICD-10-CM | POA: Diagnosis not present

## 2022-02-19 DIAGNOSIS — E119 Type 2 diabetes mellitus without complications: Secondary | ICD-10-CM | POA: Diagnosis not present

## 2022-02-19 DIAGNOSIS — Z961 Presence of intraocular lens: Secondary | ICD-10-CM | POA: Diagnosis not present

## 2022-02-19 DIAGNOSIS — H2512 Age-related nuclear cataract, left eye: Secondary | ICD-10-CM | POA: Diagnosis not present

## 2022-04-02 DIAGNOSIS — I129 Hypertensive chronic kidney disease with stage 1 through stage 4 chronic kidney disease, or unspecified chronic kidney disease: Secondary | ICD-10-CM | POA: Diagnosis not present

## 2022-04-02 DIAGNOSIS — E1122 Type 2 diabetes mellitus with diabetic chronic kidney disease: Secondary | ICD-10-CM | POA: Diagnosis not present

## 2022-04-05 DIAGNOSIS — F339 Major depressive disorder, recurrent, unspecified: Secondary | ICD-10-CM | POA: Diagnosis not present

## 2022-04-05 DIAGNOSIS — I129 Hypertensive chronic kidney disease with stage 1 through stage 4 chronic kidney disease, or unspecified chronic kidney disease: Secondary | ICD-10-CM | POA: Diagnosis not present

## 2022-04-05 DIAGNOSIS — Z23 Encounter for immunization: Secondary | ICD-10-CM | POA: Diagnosis not present

## 2022-04-05 DIAGNOSIS — Z0000 Encounter for general adult medical examination without abnormal findings: Secondary | ICD-10-CM | POA: Diagnosis not present

## 2022-04-05 DIAGNOSIS — I6523 Occlusion and stenosis of bilateral carotid arteries: Secondary | ICD-10-CM | POA: Diagnosis not present

## 2022-04-05 DIAGNOSIS — J449 Chronic obstructive pulmonary disease, unspecified: Secondary | ICD-10-CM | POA: Diagnosis not present

## 2022-04-05 DIAGNOSIS — E1122 Type 2 diabetes mellitus with diabetic chronic kidney disease: Secondary | ICD-10-CM | POA: Diagnosis not present

## 2022-04-15 ENCOUNTER — Other Ambulatory Visit: Payer: Self-pay | Admitting: Cardiovascular Disease

## 2022-04-24 DIAGNOSIS — Z1212 Encounter for screening for malignant neoplasm of rectum: Secondary | ICD-10-CM | POA: Diagnosis not present

## 2022-04-24 DIAGNOSIS — Z1211 Encounter for screening for malignant neoplasm of colon: Secondary | ICD-10-CM | POA: Diagnosis not present

## 2022-05-04 LAB — COLOGUARD: COLOGUARD: POSITIVE — AB

## 2022-05-09 ENCOUNTER — Encounter: Payer: Self-pay | Admitting: Internal Medicine

## 2022-05-15 DIAGNOSIS — E119 Type 2 diabetes mellitus without complications: Secondary | ICD-10-CM | POA: Diagnosis not present

## 2022-05-15 DIAGNOSIS — I4891 Unspecified atrial fibrillation: Secondary | ICD-10-CM | POA: Diagnosis not present

## 2022-05-15 DIAGNOSIS — R195 Other fecal abnormalities: Secondary | ICD-10-CM | POA: Diagnosis not present

## 2022-05-22 ENCOUNTER — Telehealth: Payer: Self-pay

## 2022-05-22 ENCOUNTER — Telehealth: Payer: Self-pay | Admitting: *Deleted

## 2022-05-22 NOTE — Telephone Encounter (Signed)
   Name: Lorraine Miller  DOB: Jun 13, 1945  MRN: [id number]  Primary Cardiologist: Shelva Majestic, MD   Preoperative team, please contact this patient and set up a phone call appointment for further preoperative risk assessment. Please obtain consent and complete medication review. Thank you for your help.  I confirm that guidance regarding antiplatelet and oral anticoagulation therapy has been completed and, if necessary, noted below (none requested).  Will remove from pre-op pool.    Darreld Mclean, PA-C 05/22/2022, 12:47 PM [place]

## 2022-05-22 NOTE — Telephone Encounter (Signed)
   Pre-operative Risk Assessment    Patient Name: Lorraine Miller  DOB: [birth year] MRN: [id number]      Request for Surgical Clearance    Procedure:   Colonoscopy with Propofol  Date of Surgery:  Clearance 05/30/22                                 Surgeon:  Tory Emerald. Benson Norway, M.D Surgeon's Group or Practice Name:  [facility] Phone number:  [phone] Fax number:  [phone]   Type of Clearance Requested:   - Medical    Type of Anesthesia:  Not Indicated   Additional requests/questions:    Signed, Ellwood Handler   01/49/9692, 12:26 PM

## 2022-05-22 NOTE — Telephone Encounter (Signed)
Pt has been scheduled for tele pre op appt 05/28/22 @ 2:40. Med rec and consent are done.     Patient Consent for Virtual Visit        Lorraine Miller has provided verbal consent on 05/22/2022 for a virtual visit (video or telephone).   CONSENT FOR VIRTUAL VISIT FOR:  Lorraine Miller  By participating in this virtual visit I agree to the following:  I hereby voluntarily request, consent and authorize [facility] and its employed or contracted physicians, physician assistants, nurse practitioners or other licensed health care professionals (the Practitioner), to provide me with telemedicine health care services (the "Services") as deemed necessary by the treating Practitioner. I acknowledge and consent to receive the Services by the Practitioner via telemedicine. I understand that the telemedicine visit will involve communicating with the Practitioner through live audiovisual communication technology and the disclosure of certain medical information by electronic transmission. I acknowledge that I have been given the opportunity to request an in-person assessment or other available alternative prior to the telemedicine visit and am voluntarily participating in the telemedicine visit.  I understand that I have the right to withhold or withdraw my consent to the use of telemedicine in the course of my care at any time, without affecting my right to future care or treatment, and that the Practitioner or I may terminate the telemedicine visit at any time. I understand that I have the right to inspect all information obtained and/or recorded in the course of the telemedicine visit and may receive copies of available information for a reasonable fee.  I understand that some of the potential risks of receiving the Services via telemedicine include:  Delay or interruption in medical evaluation due to technological equipment failure or disruption; Information transmitted may not be sufficient (e.g.  poor resolution of images) to allow for appropriate medical decision making by the Practitioner; and/or  In rare instances, security protocols could fail, causing a breach of personal health information.  Furthermore, I acknowledge that it is my responsibility to provide information about my medical history, conditions and care that is complete and accurate to the best of my ability. I acknowledge that Practitioner's advice, recommendations, and/or decision may be based on factors not within their control, such as incomplete or inaccurate data provided by me or distortions of diagnostic images or specimens that may result from electronic transmissions. I understand that the practice of medicine is not an exact science and that Practitioner makes no warranties or guarantees regarding treatment outcomes. I acknowledge that a copy of this consent can be made available to me via my patient portal ([facility]), or I can request a printed copy by calling the office of [facility].    I understand that my insurance will be billed for this visit.   I have read or had this consent read to me. I understand the contents of this consent, which adequately explains the benefits and risks of the Services being provided via telemedicine.  I have been provided ample opportunity to ask questions regarding this consent and the Services and have had my questions answered to my satisfaction. I give my informed consent for the services to be provided through the use of telemedicine in my medical care

## 2022-05-22 NOTE — Telephone Encounter (Signed)
   Pre-operative Risk Assessment    Patient Name: Lorraine Miller  DOB: 1946/05/01 MRN: [id number]      Request for Surgical Clearance    Procedure:   COLONOSCOPY WITH PROPOFOL  Date of Surgery:  Clearance 05/30/22                                 Surgeon:  DR. PATRICK HUNG  Surgeon's Group or Practice Name:  [facility] [place] Phone number:  [phone] Fax number:  [phone]   Type of Clearance Requested:   - Medical    Type of Anesthesia:  Not Indicated   Additional requests/questions:    Signed, Zebedee Iba   05/22/2022, 1:48 PM

## 2022-05-22 NOTE — Telephone Encounter (Signed)
Pt has been scheduled for tele pre op appt 05/28/22 @ 2:40. Med rec and consent are done.

## 2022-05-22 NOTE — Telephone Encounter (Signed)
Duplicate request. Patient already has a virtual visit to address pre-op evaluation scheduled for 05/28/2022. Will remove this from pre-op pool.  Darreld Mclean, PA-C 05/22/2022 1:58 PM

## 2022-05-28 ENCOUNTER — Ambulatory Visit: Payer: PPO | Attending: Cardiovascular Disease | Admitting: Nurse Practitioner

## 2022-05-28 ENCOUNTER — Encounter: Payer: Self-pay | Admitting: Nurse Practitioner

## 2022-05-28 DIAGNOSIS — Z01810 Encounter for preprocedural cardiovascular examination: Secondary | ICD-10-CM | POA: Diagnosis not present

## 2022-05-28 NOTE — Progress Notes (Signed)
Virtual Visit via Telephone Note   Because of Lorraine Miller's co-morbid illnesses, she is at least at moderate risk for complications without adequate follow up.  This format is felt to be most appropriate for this patient at this time.  The patient did not have access to video technology/had technical difficulties with video requiring transitioning to audio format only (telephone).  All issues noted in this document were discussed and addressed.  No physical exam could be performed with this format.  Please refer to the patient's chart for her consent to telehealth for [facility].  Evaluation Performed:  Preoperative cardiovascular risk assessment _____________   Date:  05/28/2022   Patient ID:  Lorraine Miller, DOB [birth year], MRN [id number] Patient Location:  Home Provider location:   Office  Primary Care Provider:  Deland Pretty, MD Primary Cardiologist:  Shelva Majestic, MD  Chief Complaint / Patient Profile   76 y.o. y/o female with a h/o presyncope, hypertension, hyperlipidemia, type 2 diabetes, COPD, anxiety, depression who is pending colonoscopy on 05/30/2022 with Dr. Carol Ada of [facility] and presents today for telephonic preoperative cardiovascular risk assessment.  History of Present Illness    Lorraine Miller is a 76 y.o. female who presents via audio/video conferencing for a telehealth visit today.  Pt was last seen in cardiology clinic on 06/25/2021 by Dr. Claiborne Billings. At that time Lorraine Miller was doing well. The patient is now pending procedure as outlined above. Since her last visit, she has done well from a cardiac standpoint.   She denies chest pain, palpitations, dyspnea, pnd, orthopnea, n, v, dizziness, syncope, edema, weight gain, or early satiety. All other systems reviewed and are otherwise negative except as noted above.   Past Medical History    Past Medical History:  Diagnosis Date   Arthritis    Cancer ([place])    Breast cancer    Colon polyp    COPD (chronic obstructive pulmonary disease) ([place])    Depression    Diabetes mellitus without complication ([place])    Past Surgical History:  Procedure Laterality Date   ABDOMINAL HYSTERECTOMY     ANTERIOR CERVICAL DISCECTOMY     APPENDECTOMY     BREAST LUMPECTOMY     double mastectomy     PARATHYROIDECTOMY     POSTERIOR CERVICAL FUSION/FORAMINOTOMY     surgical breast biopsy     TONSILLECTOMY      Allergies  Allergies  Allergen Reactions   Abilify [Aripiprazole]    Ambien [Zolpidem Tartrate]     Had really bad nightmares   Ambien [Zolpidem] Other (See Comments)   Gabapentin     In a fog and could not function   Trazodone Hcl Other (See Comments)    Home Medications    Prior to Admission medications   Medication Sig Start Date End Date Taking? Authorizing Provider  albuterol (PROVENTIL HFA) 108 (90 Base) MCG/ACT inhaler Inhale 2 puffs every 6 (six) hours as needed into the lungs for wheezing or shortness of breath. 04/10/17   Tanda Rockers, MD  budesonide-formoterol (SYMBICORT) 80-4.5 MCG/ACT inhaler Inhale 2 puffs as directed into the lungs. 04/10/17   Tanda Rockers, MD  Cholecalciferol (VITAMIN D) 2000 UNITS tablet Take 2,000 Units by mouth daily.    [provider]  citalopram (CELEXA) 20 MG tablet Take 30 mg by mouth daily. 1 1/2 Tablet Daily 10/28/19   [provider]  FLUZONE HIGH-DOSE QUADRIVALENT 0.7 ML SUSY  03/17/21  [provider]  furosemide (LASIX) 40 MG tablet Take 1 tablet (40 mg total) by mouth daily as needed for edema. 05/09/15   Golden Circle, FNP  losartan (COZAAR) 25 MG tablet Take 12.5 mg by mouth daily. Take 1/2 (12.'5mg'$ ) tablet by mouth daily    [provider]  Magnesium 400 MG CAPS Take 400 mg by mouth.    [provider]  metFORMIN (GLUCOPHAGE) 500 MG tablet Take 1 tablet (500 mg total) by mouth 2 (two) times daily with a meal. 08/02/15   Golden Circle, FNP  metoprolol succinate  (TOPROL-XL) 25 MG 24 hr tablet TAKE 1/2 TABLET BY MOUTH DAILY 04/15/22   Troy Sine, MD  mirtazapine (REMERON) 15 MG tablet Take 7.5 mg by mouth daily. 04/03/21   [provider]  PFIZER COVID-19 VAC BIVALENT injection  04/14/21   [provider]  rosuvastatin (CRESTOR) 10 MG tablet TAKE ONE TABLET BY MOUTH DAILY 07/31/17   Troy Sine, MD    Physical Exam    Vital Signs:  Lorraine Miller does not have vital signs available for review today.  Given telephonic nature of communication, physical exam is limited. AAOx3. NAD. Normal affect.  Speech and respirations are unlabored.  Accessory Clinical Findings    None  Assessment & Plan    1.  Preoperative Cardiovascular Risk Assessment:  According to the Revised Cardiac Risk Index (RCRI), her Perioperative Risk of Major Cardiac Event is (%): 0.4. Her Functional Capacity in METs is: 7.34 according to the Duke Activity Status Index (DASI).  Therefore, based on ACC/AHA guidelines, patient would be at acceptable risk for the planned procedure without further cardiovascular testing.   The patient was advised that if she develops new symptoms prior to surgery to contact our office to arrange for a follow-up visit, and she verbalized understanding.   A copy of this note will be routed to requesting surgeon.  Time:   Today, I have spent 5 minutes with the patient with telehealth technology discussing medical history, symptoms, and management plan.     Lenna Sciara, NP  05/28/2022, 2:54 PM

## 2022-05-30 DIAGNOSIS — D120 Benign neoplasm of cecum: Secondary | ICD-10-CM | POA: Diagnosis not present

## 2022-05-30 DIAGNOSIS — D123 Benign neoplasm of transverse colon: Secondary | ICD-10-CM | POA: Diagnosis not present

## 2022-05-30 DIAGNOSIS — K5730 Diverticulosis of large intestine without perforation or abscess without bleeding: Secondary | ICD-10-CM | POA: Diagnosis not present

## 2022-05-30 DIAGNOSIS — R195 Other fecal abnormalities: Secondary | ICD-10-CM | POA: Diagnosis not present

## 2022-05-30 DIAGNOSIS — Z1211 Encounter for screening for malignant neoplasm of colon: Secondary | ICD-10-CM | POA: Diagnosis not present

## 2022-05-30 DIAGNOSIS — D125 Benign neoplasm of sigmoid colon: Secondary | ICD-10-CM | POA: Diagnosis not present

## 2022-05-30 DIAGNOSIS — D127 Benign neoplasm of rectosigmoid junction: Secondary | ICD-10-CM | POA: Diagnosis not present

## 2022-05-30 DIAGNOSIS — K635 Polyp of colon: Secondary | ICD-10-CM | POA: Diagnosis not present

## 2022-07-03 ENCOUNTER — Encounter: Payer: PPO | Admitting: Internal Medicine

## 2022-07-03 DIAGNOSIS — R600 Localized edema: Secondary | ICD-10-CM | POA: Diagnosis not present

## 2022-07-03 DIAGNOSIS — Z86000 Personal history of in-situ neoplasm of breast: Secondary | ICD-10-CM | POA: Diagnosis not present

## 2022-07-03 DIAGNOSIS — Z9013 Acquired absence of bilateral breasts and nipples: Secondary | ICD-10-CM | POA: Diagnosis not present

## 2022-07-03 DIAGNOSIS — Z01419 Encounter for gynecological examination (general) (routine) without abnormal findings: Secondary | ICD-10-CM | POA: Diagnosis not present

## 2022-08-07 DIAGNOSIS — J449 Chronic obstructive pulmonary disease, unspecified: Secondary | ICD-10-CM | POA: Diagnosis not present

## 2022-08-07 DIAGNOSIS — E785 Hyperlipidemia, unspecified: Secondary | ICD-10-CM | POA: Diagnosis not present

## 2022-08-07 DIAGNOSIS — Z780 Asymptomatic menopausal state: Secondary | ICD-10-CM | POA: Diagnosis not present

## 2022-08-07 DIAGNOSIS — I10 Essential (primary) hypertension: Secondary | ICD-10-CM | POA: Diagnosis not present

## 2022-08-07 DIAGNOSIS — E1169 Type 2 diabetes mellitus with other specified complication: Secondary | ICD-10-CM | POA: Diagnosis not present

## 2022-08-07 DIAGNOSIS — Z712 Person consulting for explanation of examination or test findings: Secondary | ICD-10-CM | POA: Diagnosis not present

## 2022-09-10 ENCOUNTER — Ambulatory Visit: Payer: PPO | Attending: Cardiovascular Disease | Admitting: Cardiovascular Disease

## 2022-09-10 ENCOUNTER — Encounter: Payer: Self-pay | Admitting: Cardiovascular Disease

## 2022-09-10 VITALS — BP 102/64 | HR 67 | Ht 60.0 in | Wt 153.8 lb

## 2022-09-10 DIAGNOSIS — F32A Depression, unspecified: Secondary | ICD-10-CM | POA: Diagnosis not present

## 2022-09-10 DIAGNOSIS — J449 Chronic obstructive pulmonary disease, unspecified: Secondary | ICD-10-CM | POA: Diagnosis not present

## 2022-09-10 DIAGNOSIS — I10 Essential (primary) hypertension: Secondary | ICD-10-CM

## 2022-09-10 DIAGNOSIS — E785 Hyperlipidemia, unspecified: Secondary | ICD-10-CM | POA: Diagnosis not present

## 2022-09-10 DIAGNOSIS — F419 Anxiety disorder, unspecified: Secondary | ICD-10-CM | POA: Diagnosis not present

## 2022-09-10 NOTE — Progress Notes (Signed)
Cardiology Office Note    Date:  09/17/2022   ID:  Lorraine Miller, DOB 03/05/1946, MRN [id number]  PCP:  Merri Brunette, MD  Cardiologist:  Nicki Guadalajara, MD   39-month follow-up evaluation.   History of Present Illness:  Lorraine Miller is a 77 y.o. female who established cardiology care with me in January 2018. I last saw her in January 2023.  She presents for a 15 month follow-up cardiology evaluation  Lorraine Miller has a history of diabetes mellitus, COPD, hyperlipidemia, as well as depression.  Over10 years ago, she had undergone a stress test by Dr. Worthy Keeler at the [facility] in [place] which was abnormal and ultimately had a heart catheterization by Dr.[name]..  She was told that her coronaries were normal at that time.  She has a history of breast CA in situ and underwent double mastectomy with subsequent breast reconstructive surgery.  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  Reportedly her husband took her blood pressure which was very low.  By the time EMS arrived, the patient states EMS blood pressure recording was 90/55.  She presented to [facility] hospital and on presentation her blood pressure had risen to 115/52.  CT scan did not reveal any acute abnormality.  She had mild left frontal sinus disease.  A chest x-ray showed COPD with mild bibasal atelectasis glass scarring.  Her ECG was unremarkable.  Troponins were negative.  She again had another short-lived episode of presyncope on New Year's Day, which ultimately resolved.  She is uncertain if she was dehydrated on both of these occasions.  She denied associated chest tightness.  She saw me initially in January 2018 after she had seen on the [facility] record that she may have mild renal failure and heart failure.  The patient states she was never told this and presented for cardiologic evaluation.  I reassured her after reviewing the [facility] medical record  laboratory that she did not have renal failure, but actually had stage II renal staging.  I scheduled her for 2-D echo Doppler study which was done 03/15/2017 and showed an EF of 55-60%.  There was mild grade 1 diastolic dysfunction with aortic valve sclerosis.  She underwent follow-up laboratory on February 1 18, which showed a creatinine of 0.83.  Lipid studies were notable for total cholesterol 161, HDL 47, LDL 85, and triglycerides 191.    I recommended that she discontinue lovastatin and changed rosuvastatin 10 mg for more aggressive lipid-lowering.  Subsequent laboratory in October 2018 showed improvement in LDL cholesterol down to 54.  When I last saw her in March 2019 she was continued to do well from a cardiac standpoint.  She has continued to feel well from a heart standpoint.  Her blood pressure has always been normal.  She has had issues with left leg discomfort.  S  She has had issues remotely of cervical neck disease but does not recall being evaluated for lumbar sacral disease.  She also at times notes that her fingers get cold.  There is remote tobacco history but she quit smoking in 2003.  He does not require being cold temperatures for this sensation.    She was evaluated in a telemedicine visit by Micah Flesher in June 2020 and at that time remained cardiac stable.  I saw her in August 2021 at which time she continued to do well.  She specifically denied chest pain or shortness of breath.  She does experience a rare palpitation which typically are stress mediated.  At times she does have considerable stress with her husband who has some heart failure issues.  She is followed by Dr. Edrick Oh and he recently increased her Celexa dose.  She also was having some sleep issues and she is now sleeping well with the addition of Lunesta 2 mg.  She underwent repeat laboratory in April 2021 by Dr. Edrick Oh.  Renal function was excellent with a creatinine of 0.85.  LFTs were normal.  Hemoglobin A1c was 6.2.   LDL cholesterol was 49.    Since I saw her she has been evaluated by Judy Pimple with her last evaluation in November 2021.  At that time she presented for evaluation of a syncopal spell.  Her blood pressure was low.  That time losartan was discontinued. She underwent an echo Doppler study in December 2021 which showed normal systolic function with EF at 60 to 65%.  There was no significant valvular abnormality.  I saw her on November 15, 2020 at which time she felt well.  She was taking metoprolol succinate 12.5 mg daily, rosuvastatin 10 mg, and was taking furosemide 40 mg as needed depending upon edema.  She is followed by Dr. Renne Crigler who checks laboratory.  Her blood pressure has been stable.  She denied any further episodes of presyncope or syncope and denied any chest pain.    I last saw her on June 25, 2021 at which time she felt well and denied any chest pain, shortness of breath, or palpitations.  She was not having any orthostatic symptomatology.   There has not been any edema and she has rarely taking furosemide.  She continues to be on losartan 12.5 mg daily in addition to metoprolol succinate 12.5 mg.  She continues to be on rosuvastatin 10 mg for hyperlipidemia and LDL cholesterol had been in the 50s.  She takes Symbicort and as needed albuterol for mild COPD.  She continues to take citalopram for depression.    Since I last saw her, she states her husband is starting to develop some dementia symptoms.  She denies chest pain shortness of breath or palpitations.  She has allergies particularly with pollen.  She is followed by Dr. Merri Brunette who checks laboratory.  She presents for evaluation.  Past Medical History:  Diagnosis Date   Arthritis    Cancer    Breast cancer   Colon polyp    COPD (chronic obstructive pulmonary disease)    Depression    Diabetes mellitus without complication     Past Surgical History:  Procedure Laterality Date   ABDOMINAL HYSTERECTOMY     ANTERIOR  CERVICAL DISCECTOMY     APPENDECTOMY     BREAST LUMPECTOMY     double mastectomy     PARATHYROIDECTOMY     POSTERIOR CERVICAL FUSION/FORAMINOTOMY     surgical breast biopsy     TONSILLECTOMY      Current Medications: Outpatient Medications Prior to Visit  Medication Sig Dispense Refill   albuterol (PROVENTIL HFA) 108 (90 Base) MCG/ACT inhaler Inhale 2 puffs every 6 (six) hours as needed into the lungs for wheezing or shortness of breath. 1 Inhaler 11   budesonide-formoterol (SYMBICORT) 80-4.5 MCG/ACT inhaler Inhale 2 puffs as directed into the lungs. 1 Inhaler 11   Cholecalciferol (VITAMIN D) 2000 UNITS tablet Take 2,000 Units by mouth daily.     citalopram (CELEXA) 20 MG tablet Take 30 mg by mouth daily. 1 1/2 Tablet  Daily     FLUZONE HIGH-DOSE QUADRIVALENT 0.7 ML SUSY      furosemide (LASIX) 40 MG tablet Take 1 tablet (40 mg total) by mouth daily as needed for edema. 30 tablet 2   losartan (COZAAR) 25 MG tablet Take 12.5 mg by mouth daily. Take 1/2 (12.5mg ) tablet by mouth daily     Magnesium 400 MG CAPS Take 400 mg by mouth.     metFORMIN (GLUCOPHAGE) 500 MG tablet Take by mouth. Take 1000 mg ( 2 tablets) in the morning meal  and  500 mg ( 1 tablet) in the evening with meals     metoprolol succinate (TOPROL-XL) 25 MG 24 hr tablet TAKE 1/2 TABLET BY MOUTH DAILY 45 tablet 3   mirtazapine (REMERON) 15 MG tablet Take 7.5 mg by mouth daily.     PFIZER COVID-19 VAC BIVALENT injection      rosuvastatin (CRESTOR) 10 MG tablet TAKE ONE TABLET BY MOUTH DAILY 90 tablet 2   metFORMIN (GLUCOPHAGE) 500 MG tablet Take 1 tablet (500 mg total) by mouth 2 (two) times daily with a meal. (Patient not taking: Reported on 09/10/2022) 60 tablet 2   No facility-administered medications prior to visit.     Allergies:   Abilify [aripiprazole], Ambien [zolpidem tartrate], Ambien [zolpidem], Gabapentin, and Trazodone hcl   Social History   Socioeconomic History   Marital status: Married    Spouse name: Not  on file   Number of children: 2   Years of education: 14   Highest education level: Not on file  Occupational History   Not on file  Tobacco Use   Smoking status: Former    Packs/day: 0.25    Years: 28.00    Additional pack years: 0.00    Total pack years: 7.00    Types: Cigarettes    Quit date: 06/03/2001    Years since quitting: 21.3   Smokeless tobacco: Never  Substance and Sexual Activity   Alcohol use: No    Alcohol/week: 0.0 standard drinks of alcohol   Drug use: No   Sexual activity: Not on file  Other Topics Concern   Not on file  Social History Narrative   Fun: Paint and crafts   Denies religious beliefs effecting health care.   Denies abuse and feels safe at home.    Social Determinants of Health   Financial Resource Strain: Not on file  Food Insecurity: Not on file  Transportation Needs: Not on file  Physical Activity: Not on file  Stress: Not on file  Social Connections: Not on file     Family History:  The patient's family history includes Asthma in her daughter, maternal grandfather, mother, and sister; Diabetes in her father and paternal grandfather; Healthy in her maternal grandfather, maternal grandmother, and paternal grandfather; Heart disease in her father; Multiple myeloma in her mother; Stomach cancer in her paternal grandmother.   ROS General: Negative; No fevers, chills, or night sweats;  HEENT: Negative; No changes in vision or hearing, sinus congestion, difficulty swallowing Pulmonary: No recent cough, wheezing, shortness of breath, hemoptysis Cardiovascular: See history of present illness GI: Negative; No nausea, vomiting, diarrhea, or abdominal pain GU: Negative; No dysuria, hematuria, or difficulty voiding Musculoskeletal: Negative; no myalgias, joint pain, or weakness Hematologic/Oncology: Negative; no easy bruising, bleeding Endocrine: Positive for diabetes mellitus Neuro: Negative; no changes in balance, headaches Skin: Negative; No  rashes or skin lesions Psychiatric: Positive for increased stress, improved, remote history of depression Sleep: Negative; No snoring, daytime sleepiness,  hypersomnolence, bruxism, restless legs, hypnogognic hallucinations, no cataplexy Other comprehensive 14 point system review is negative.   PHYSICAL EXAM:   VS:  BP 102/64 (BP Location: Right Arm, Patient Position: Sitting, Cuff Size: Normal)   Pulse 67   Ht 5' (1.524 m)   Wt 153 lb 12.8 oz (69.8 kg)   SpO2 96%   BMI 30.04 kg/m     Repeat blood pressure by me was 118/68 supine and 102/66 standing  Wt Readings from Last 3 Encounters:  09/10/22 153 lb 12.8 oz (69.8 kg)  06/25/21 148 lb 3.2 oz (67.2 kg)  11/15/20 141 lb (64 kg)    General: Alert, oriented, no distress.  Skin: normal turgor, no rashes, warm and dry HEENT: Normocephalic, atraumatic. Pupils equal round and reactive to light; sclera anicteric; extraocular muscles intact;  Nose without nasal septal hypertrophy Mouth/Parynx benign; Mallinpatti scale 3 Neck: No JVD, no carotid bruits; normal carotid upstroke Lungs: clear to ausculatation and percussion; no wheezing or rales Chest wall: without tenderness to palpitation Heart: PMI not displaced, RRR, s1 s2 normal, 1/6 systolic murmur, no diastolic murmur, no rubs, gallops, thrills, or heaves Abdomen: soft, nontender; no hepatosplenomehaly, BS+; abdominal aorta nontender and not dilated by palpation. Back: no CVA tenderness Pulses 2+ Musculoskeletal: full range of motion, normal strength, no joint deformities Extremities: no clubbing cyanosis or edema, Homan's sign negative  Neurologic: grossly nonfocal; Cranial nerves grossly wnl Psychologic: Normal mood and affect   Studies/Labs Reviewed:   September 10, 2022 ECG (independently read by me): NSR at 67, no ST changes  June 25, 2021   ECG (independently read by me): NSR at 61; no ectopy  November 15, 2020 ECG (independently read by me): Normal sinus rhythm at 64 bpm,  normal intervals, no ST segment changes.  No ectopy.  August 2021 ECG (independently read by me): Normal sinus rhythm at 72 bpm; no ST changes, normal intervals  March 2019 ECG (independently read by me): sinus rhythm at 66 bpm.  No ectopy.  Normal intervals.  No ST segment changes.  October 2018 ECG (independently read by me): normal sinus rhythm at 80 bpm.  Normal intervals.  No ectopy.  March 2018 ECG (independently read by me): Normal sinus rhythm at 81 bpm.  Normal intervals.  No ectopy.  No ST segment changes.  January 2018 EKG:  EKG is ordered today.  ECG (independently read by me): Normal sinus rhythm at 77 bpm.  No significant ST-T changes.  PR interval 114 ms; QRS duration 84 ms; QTc interval normal at 411 milliseconds.  Recent Labs:    Latest Ref Rng & Units 11/14/2016    8:25 AM 07/04/2016    9:01 AM 05/01/2016    4:00 PM  BMP  Glucose 65 - 99 mg/dL 161  096  045   BUN 7 - 25 mg/dL Creatinine 0.60 - 0.93 mg/dL 4.09  8.11  9.14   Sodium 135 - 146 mmol/L 139  140  141   Potassium 3.5 - 5.3 mmol/L 4.6  4.3  3.8   Chloride 98 - 110 mmol/L 103  104  105   CO2 20 - 31 mmol/L Calcium 8.6 - 10.4 mg/dL 9.3  8.9  9.4         Latest Ref Rng & Units 12/16/2016    8:35 AM 11/14/2016    8:25 AM 07/04/2016    9:01 AM  Hepatic Function  Total  Protein 6.0 - 8.5 g/dL 6.2  6.2  6.3   Albumin 3.5 - 4.8 g/dL 4.7  4.3  4.1   AST 0 - 40 IU/L ALT 0 - 32 IU/L 30  42  24   Alk Phosphatase 39 - 117 IU/L 61  60  55   Total Bilirubin 0.0 - 1.2 mg/dL 0.9  1.2  1.6   Bilirubin, Direct 0.00 - 0.40 mg/dL 4.54          Latest Ref Rng & Units 07/04/2016    9:01 AM 05/01/2016    4:00 PM 12/20/2013    2:25 PM  CBC  WBC 3.8 - 10.8 K/uL 6.1  10.5  8.7   Hemoglobin 11.7 - 15.5 g/dL 09.8  11.9  14.7   Hematocrit 35.0 - 45.0 % 40.7  41.5  42.3   Platelets 140 - 400 K/uL 343  330  320    Lab Results  Component Value Date   MCV 87.5 07/04/2016   MCV 87.7  05/01/2016   MCV 91 12/20/2013   Lab Results  Component Value Date   TSH 3.35 07/04/2016   Lab Results  Component Value Date   HGBA1C 6.0 (H) 07/04/2016     BNP No results found for: "BNP"  ProBNP No results found for: "PROBNP"   Lipid Panel     Component Value Date/Time   CHOL 129 11/14/2016 0825   TRIG 77 11/14/2016 0825   HDL 58 11/14/2016 0825   CHOLHDL 2.2 11/14/2016 0825   VLDL 15 11/14/2016 0825   LDLCALC 56 11/14/2016 0825     RADIOLOGY: No results found.   Additional studies/ records that were reviewed today include:  I reviewed the emergency room records as well as records from [place], recent labs and echo.   ECHO: 05/22/2020 IMPRESSIONS   1. Left ventricular ejection fraction, by estimation, is 60 to 65%. The  left ventricle has normal function. The left ventricle has no regional  wall motion abnormalities. Left ventricular diastolic parameters are  indeterminate.   2. Right ventricular systolic function is normal. The right ventricular  size is normal. There is normal pulmonary artery systolic pressure.   3. The mitral valve is normal in structure. Trivial mitral valve  regurgitation. No evidence of mitral stenosis.   4. The aortic valve was not well visualized. Aortic valve regurgitation  is not visualized. No aortic stenosis is present.   5. The inferior vena cava is normal in size with greater than 50%  respiratory variability, suggesting right atrial pressure of 3 mmHg.   ASSESSMENT:    1. Essential hypertension   2. Hyperlipidemia with target LDL less than 70   3. Anxiety and depression   4. COPD mixed type    PLAN:  Lorraine Miller is a 77 year old female who has a history of type 2 diabetes mellitus, hypertension, hyperlipidemia, and COPD.   A nuclear stress test done over 15 years ago raised concern for ischemia and definitive cardiac catheterization done in [place] revealed normal coronary arteries.  She had  experienced 2 episodes of presyncope while standing.  When I initially saw her, she was not orthostatic.  I reduced her diuretic regimen.  She did not have renal failure or CHF.  Her echo Doppler study of February 2018 revealed normal systolic function although there was evidence for mild grade 1 diastolic dysfunction. Carotid studies revealed mild plaque bilaterally, less than 39%.  When seen in  August 2021 she was on losartan 25 mg in addition to metoprolol succinate 12.5 mg daily.  She had a near syncopal spell associated with hypotension at which time she was seen in November 2021 by Judy PimpleKrista Kroeger, PA and with low blood pressure it was recommended to discontinue losartan.  Presently, her blood pressure is stable but on the low side and she does have mild drop in blood pressure going supine to standing.  She has been taking losartan 12.5 mg.  She she rarely takes furosemide although has a prescription to take as needed for dyspnea.  She is on rosuvastatin 10 mg for hyperlipidemia.  She takes citalopram 30 mg for depression.  She continues to take Symbicort and as needed albuterol.  She has lower extremity varicosities without significant edema.  Hemoglobin A1c recently was elevated at 6.7.  She is now on metformin 1000 mg in the morning and 500 mg in the evening followed by Dr. Merri BrunetteWalter Pharr.  She is not having any anginal symptoms or palpitations.  As long as she is stable I will see her in 1 year for follow-up evaluation.   Medication Adjustments/Labs and Tests Ordered: Current medicines are reviewed at length with the patient today.  Concerns regarding medicines are outlined above.  Medication changes, Labs and Tests ordered today are listed in the Patient Instructions below. Patient Instructions  Medication Instructions:   No changes *If you need a refill on your cardiac medications before your next appointment, please call your pharmacy*   Lab Work: Not needed    Testing/Procedures:  Not  needed  Follow-Up: At [facility], you and your health needs are our priority.  As part of our continuing mission to provide you with exceptional heart care, we have created designated Provider Care Teams.  These Care Teams include your primary Cardiologist (physician) and Advanced Practice Providers (APPs -  Physician Assistants and Nurse Practitioners) who all work together to provide you with the care you need, when you need it.     Your next appointment:   12 month(s)  The format for your next appointment:   In Person  Provider:   Nicki Guadalajarahomas Roselyne Stalnaker, MD      Signed, Nicki Guadalajarahomas Ravindra Baranek, MD , [facility] 09/17/2022 5:38 PM    [facility] [address], [address], [place], [place]  [id number] Phone: [phone]

## 2022-09-10 NOTE — Patient Instructions (Signed)
Medication Instructions:  No changes   *If you need a refill on your cardiac medications before your next appointment, please call your pharmacy*   Lab Work: Not needed   Testing/Procedures: Not needed   Follow-Up: At [facility], you and your health needs are our priority.  As part of our continuing mission to provide you with exceptional heart care, we have created designated Provider Care Teams.  These Care Teams include your primary Cardiologist (physician) and Advanced Practice Providers (APPs -  Physician Assistants and Nurse Practitioners) who all work together to provide you with the care you need, when you need it.     Your next appointment:   12 month(s)  The format for your next appointment:   In Person  Provider:   Thomas Kelly, MD     

## 2022-09-17 ENCOUNTER — Encounter: Payer: Self-pay | Admitting: Cardiovascular Disease

## 2023-02-25 DIAGNOSIS — E119 Type 2 diabetes mellitus without complications: Secondary | ICD-10-CM | POA: Diagnosis not present

## 2023-02-25 DIAGNOSIS — Z961 Presence of intraocular lens: Secondary | ICD-10-CM | POA: Diagnosis not present

## 2023-02-25 DIAGNOSIS — H2512 Age-related nuclear cataract, left eye: Secondary | ICD-10-CM | POA: Diagnosis not present

## 2023-04-07 DIAGNOSIS — Z7689 Persons encountering health services in other specified circumstances: Secondary | ICD-10-CM | POA: Diagnosis not present

## 2023-04-07 DIAGNOSIS — E1122 Type 2 diabetes mellitus with diabetic chronic kidney disease: Secondary | ICD-10-CM | POA: Diagnosis not present

## 2023-04-07 DIAGNOSIS — E785 Hyperlipidemia, unspecified: Secondary | ICD-10-CM | POA: Diagnosis not present

## 2023-04-10 DIAGNOSIS — Z860101 Personal history of adenomatous and serrated colon polyps: Secondary | ICD-10-CM | POA: Diagnosis not present

## 2023-04-10 DIAGNOSIS — J449 Chronic obstructive pulmonary disease, unspecified: Secondary | ICD-10-CM | POA: Diagnosis not present

## 2023-04-10 DIAGNOSIS — B351 Tinea unguium: Secondary | ICD-10-CM | POA: Diagnosis not present

## 2023-04-10 DIAGNOSIS — I10 Essential (primary) hypertension: Secondary | ICD-10-CM | POA: Diagnosis not present

## 2023-04-10 DIAGNOSIS — M8580 Other specified disorders of bone density and structure, unspecified site: Secondary | ICD-10-CM | POA: Diagnosis not present

## 2023-04-10 DIAGNOSIS — Z86000 Personal history of in-situ neoplasm of breast: Secondary | ICD-10-CM | POA: Diagnosis not present

## 2023-04-10 DIAGNOSIS — E559 Vitamin D deficiency, unspecified: Secondary | ICD-10-CM | POA: Diagnosis not present

## 2023-04-10 DIAGNOSIS — I6523 Occlusion and stenosis of bilateral carotid arteries: Secondary | ICD-10-CM | POA: Diagnosis not present

## 2023-04-10 DIAGNOSIS — F339 Major depressive disorder, recurrent, unspecified: Secondary | ICD-10-CM | POA: Diagnosis not present

## 2023-04-10 DIAGNOSIS — Z0000 Encounter for general adult medical examination without abnormal findings: Secondary | ICD-10-CM | POA: Diagnosis not present

## 2023-04-10 DIAGNOSIS — E1169 Type 2 diabetes mellitus with other specified complication: Secondary | ICD-10-CM | POA: Diagnosis not present

## 2023-04-12 ENCOUNTER — Other Ambulatory Visit: Payer: Self-pay | Admitting: Cardiovascular Disease

## 2023-04-17 DIAGNOSIS — E559 Vitamin D deficiency, unspecified: Secondary | ICD-10-CM | POA: Diagnosis not present

## 2023-04-17 DIAGNOSIS — M8580 Other specified disorders of bone density and structure, unspecified site: Secondary | ICD-10-CM | POA: Diagnosis not present

## 2023-05-21 DIAGNOSIS — I4891 Unspecified atrial fibrillation: Secondary | ICD-10-CM | POA: Diagnosis not present

## 2023-05-21 DIAGNOSIS — Z1211 Encounter for screening for malignant neoplasm of colon: Secondary | ICD-10-CM | POA: Diagnosis not present

## 2023-05-22 ENCOUNTER — Telehealth: Payer: Self-pay

## 2023-05-22 NOTE — Telephone Encounter (Signed)
  Patient Consent for Virtual Visit         Lorraine Miller has provided verbal consent on 05/22/2023 for a virtual visit (video or telephone).   CONSENT FOR VIRTUAL VISIT FOR:  Lorraine Miller  By participating in this virtual visit I agree to the following:  I hereby voluntarily request, consent and authorize [facility] and its employed or contracted physicians, physician assistants, nurse practitioners or other licensed health care professionals (the Practitioner), to provide me with telemedicine health care services (the "Services") as deemed necessary by the treating Practitioner. I acknowledge and consent to receive the Services by the Practitioner via telemedicine. I understand that the telemedicine visit will involve communicating with the Practitioner through live audiovisual communication technology and the disclosure of certain medical information by electronic transmission. I acknowledge that I have been given the opportunity to request an in-person assessment or other available alternative prior to the telemedicine visit and am voluntarily participating in the telemedicine visit.  I understand that I have the right to withhold or withdraw my consent to the use of telemedicine in the course of my care at any time, without affecting my right to future care or treatment, and that the Practitioner or I may terminate the telemedicine visit at any time. I understand that I have the right to inspect all information obtained and/or recorded in the course of the telemedicine visit and may receive copies of available information for a reasonable fee.  I understand that some of the potential risks of receiving the Services via telemedicine include:  Delay or interruption in medical evaluation due to technological equipment failure or disruption; Information transmitted may not be sufficient (e.g. poor resolution of images) to allow for appropriate medical decision making by the  Practitioner; and/or  In rare instances, security protocols could fail, causing a breach of personal health information.  Furthermore, I acknowledge that it is my responsibility to provide information about my medical history, conditions and care that is complete and accurate to the best of my ability. I acknowledge that Practitioner's advice, recommendations, and/or decision may be based on factors not within their control, such as incomplete or inaccurate data provided by me or distortions of diagnostic images or specimens that may result from electronic transmissions. I understand that the practice of medicine is not an exact science and that Practitioner makes no warranties or guarantees regarding treatment outcomes. I acknowledge that a copy of this consent can be made available to me via my patient portal [facility] MyChart), or I can request a printed copy by calling the office of [facility].    I understand that my insurance will be billed for this visit.   I have read or had this consent read to me. I understand the contents of this consent, which adequately explains the benefits and risks of the Services being provided via telemedicine.  I have been provided ample opportunity to ask questions regarding this consent and the Services and have had my questions answered to my satisfaction. I give my informed consent for the services to be provided through the use of telemedicine in my medical care

## 2023-05-22 NOTE — Telephone Encounter (Signed)
Patient scheduled for tele visit on 05/23/23. Med rec and consent done

## 2023-05-22 NOTE — Telephone Encounter (Signed)
   Name: Lorraine Miller  DOB: 1945-09-08  MRN: [id number]  Primary Cardiologist: Nicki Guadalajara, MD   Preoperative team, please contact this patient and set up a phone call appointment for further preoperative risk assessment. Please obtain consent and complete medication review. Thank you for your help.  I confirm that guidance regarding antiplatelet and oral anticoagulation therapy has been completed and, if necessary, noted below.  None  I also confirmed the patient resides in the state of [place]. As per [facility] Medical Board telemedicine laws, the patient must reside in the state in which the provider is licensed.   Napoleon Form, Leodis Rains, NP 05/22/2023, 5:29 PM [facility]

## 2023-05-22 NOTE — Telephone Encounter (Signed)
   Pre-operative Risk Assessment    Patient Name: Lorraine Miller  DOB: 03/16/1946 MRN: [id number]     Request for Surgical Clearance    Procedure:  Colonoscopy   Date of Surgery:  Clearance 06/03/23                                 Surgeon:  Dr. Iva Lento Group or Practice Name:  [facility]  Phone number:  [phone] Fax number:  [phone]   Type of Clearance Requested:   Medical    Type of Anesthesia:  Propofol    Additional requests/questions:    SignedVernard Gambles   05/22/2023, 5:19 PM

## 2023-05-23 ENCOUNTER — Ambulatory Visit: Payer: PPO | Attending: Nurse Practitioner

## 2023-05-23 DIAGNOSIS — Z01810 Encounter for preprocedural cardiovascular examination: Secondary | ICD-10-CM | POA: Diagnosis not present

## 2023-05-23 NOTE — Progress Notes (Signed)
Virtual Visit via Telephone Note   Because of Lorraine Miller's co-morbid illnesses, she is at least at moderate risk for complications without adequate follow up.  This format is felt to be most appropriate for this patient at this time.  The patient did not have access to video technology/had technical difficulties with video requiring transitioning to audio format only (telephone).  All issues noted in this document were discussed and addressed.  No physical exam could be performed with this format.  Please refer to the patient's chart for her consent to telehealth for [facility].  Evaluation Performed:  Preoperative cardiovascular risk assessment _____________   Date:  05/23/2023   Patient ID:  Lorraine Miller, Lorraine Miller [birth year], MRN [id number] Patient Location:  Home Provider location:   Office  Primary Care Provider:  Merri Brunette, MD Primary Cardiologist:  Nicki Guadalajara, MD  Chief Complaint / Patient Profile   77 y.o. y/o female with a h/o DM type II, COPD, HTN, HLD, depression, breast CA, arthritis who is pending colonoscopy and presents today for telephonic preoperative cardiovascular risk assessment.  History of Present Illness    Lorraine Miller is a 77 y.o. female who presents via audio/video conferencing for a telehealth visit today.  Pt was last seen in cardiology clinic on 09/10/2022 by Dr. Tresa Endo.  At that time BENNETTE DINICOLA was doing well with no chest pain or palpitations.  Patient's blood pressure was stable no changes were made to current therapy.  The patient is now pending procedure as outlined above. Since her last visit, she has been doing well with no new cardiac complaints.  She is able to complete all of her ADLs independently without any difficulties.  She reports that her blood pressure has been stable and COPD is also under control.  She denies chest pain, shortness of breath, lower extremity edema, fatigue, palpitations, melena, hematuria, hemoptysis,  diaphoresis, weakness, presyncope, syncope, orthopnea, and PND.   Past Medical History    Past Medical History:  Diagnosis Date   Arthritis    Cancer (HCC)    Breast cancer   Colon polyp    COPD (chronic obstructive pulmonary disease) (HCC)    Depression    Diabetes mellitus without complication (HCC)    Past Surgical History:  Procedure Laterality Date   ABDOMINAL HYSTERECTOMY     ANTERIOR CERVICAL DISCECTOMY     APPENDECTOMY     BREAST LUMPECTOMY     double mastectomy     PARATHYROIDECTOMY     POSTERIOR CERVICAL FUSION/FORAMINOTOMY     surgical breast biopsy     TONSILLECTOMY      Allergies  Allergies  Allergen Reactions   Abilify [Aripiprazole]    Ambien [Zolpidem Tartrate]     Had really bad nightmares   Ambien [Zolpidem] Other (See Comments)   Gabapentin     In a fog and could not function   Trazodone Hcl Other (See Comments)    Home Medications    Prior to Admission medications   Medication Sig Start Date End Date Taking? Authorizing Provider  albuterol (PROVENTIL HFA) 108 (90 Base) MCG/ACT inhaler Inhale 2 puffs every 6 (six) hours as needed into the lungs for wheezing or shortness of breath. 04/10/17   Nyoka Cowden, MD  budesonide-formoterol (SYMBICORT) 80-4.5 MCG/ACT inhaler Inhale 2 puffs as directed into the lungs. 04/10/17   Nyoka Cowden, MD  Cholecalciferol (VITAMIN D) 2000 UNITS tablet Take 2,000 Units by mouth daily.    [provider]  citalopram (CELEXA) 20 MG tablet Take 30 mg by mouth daily. 1 1/2 Tablet Daily 10/28/19   [provider]  FLUZONE HIGH-DOSE QUADRIVALENT 0.7 ML SUSY  03/17/21   [provider]  furosemide (LASIX) 40 MG tablet Take 1 tablet (40 mg total) by mouth daily as needed for edema. 05/09/15   Veryl Speak, FNP  losartan (COZAAR) 25 MG tablet Take 12.5 mg by mouth daily. Take 1/2 (12.5mg ) tablet by mouth daily    [provider]  Magnesium 400 MG CAPS Take 400 mg by mouth.    [provider]  metFORMIN (GLUCOPHAGE) 500 MG tablet Take 1 tablet (500 mg total) by mouth 2 (two) times daily with a meal. 08/02/15   Veryl Speak, FNP  metFORMIN (GLUCOPHAGE) 500 MG tablet Take by mouth. Take 1000 mg ( 2 tablets) in the morning meal  and  500 mg ( 1 tablet) in the evening with meals    [provider]  metoprolol succinate (TOPROL-XL) 25 MG 24 hr tablet TAKE 1/2 TABLET BY MOUTH DAILY 04/14/23   Lennette Bihari, MD  mirtazapine (REMERON) 15 MG tablet Take 7.5 mg by mouth daily. 04/03/21   [provider]  PFIZER COVID-19 VAC BIVALENT injection  04/14/21   [provider]  rosuvastatin (CRESTOR) 10 MG tablet TAKE ONE TABLET BY MOUTH DAILY 07/31/17   Lennette Bihari, MD    Physical Exam    Vital Signs:  ALIECIA RODRICK does not have vital signs available for review today. 116 70  Given telephonic nature of communication, physical exam is limited. AAOx3. NAD. Normal affect.  Speech and respirations are unlabored.  Accessory Clinical Findings    None  Assessment & Plan    1.  Preoperative Cardiovascular Risk Assessment: -Patient's RCRI score is 0.4%  The patient affirms she has been doing well without any new cardiac symptoms. They are able to achieve 7 METS without cardiac limitations. Therefore, based on ACC/AHA guidelines, the patient would be at acceptable risk for the planned procedure without further cardiovascular testing. The patient was advised that if she develops new symptoms prior to surgery to contact our office to arrange for a follow-up visit, and she verbalized understanding.   The patient was advised that if she develops new symptoms prior to surgery to contact our office to arrange for a follow-up visit, and she verbalized understanding.  A copy of this note will be routed to requesting surgeon.  Time:   Today, I have spent 6 minutes with the patient with telehealth technology discussing medical history, symptoms, and  management plan.     Napoleon Form, Leodis Rains, NP  05/23/2023, 7:55 AM

## 2023-06-03 DIAGNOSIS — Q438 Other specified congenital malformations of intestine: Secondary | ICD-10-CM | POA: Diagnosis not present

## 2023-06-03 DIAGNOSIS — K5730 Diverticulosis of large intestine without perforation or abscess without bleeding: Secondary | ICD-10-CM | POA: Diagnosis not present

## 2023-06-03 DIAGNOSIS — Z538 Procedure and treatment not carried out for other reasons: Secondary | ICD-10-CM | POA: Diagnosis not present

## 2023-06-03 DIAGNOSIS — Z1211 Encounter for screening for malignant neoplasm of colon: Secondary | ICD-10-CM | POA: Diagnosis not present

## 2023-06-03 DIAGNOSIS — Z860101 Personal history of adenomatous and serrated colon polyps: Secondary | ICD-10-CM | POA: Diagnosis not present

## 2023-06-11 ENCOUNTER — Other Ambulatory Visit: Payer: Self-pay | Admitting: Gastroenterology

## 2023-08-01 ENCOUNTER — Encounter (HOSPITAL_COMMUNITY): Payer: Self-pay | Admitting: Gastroenterology

## 2023-08-08 ENCOUNTER — Ambulatory Visit (HOSPITAL_COMMUNITY)
Admission: RE | Admit: 2023-08-08 | Discharge: 2023-08-08 | Disposition: A | Discharge status: Home / Self Care | Payer: PPO | Attending: Gastroenterology | Admitting: Gastroenterology

## 2023-08-08 ENCOUNTER — Ambulatory Visit (HOSPITAL_COMMUNITY): Admitting: Anesthesiology

## 2023-08-08 ENCOUNTER — Encounter (HOSPITAL_COMMUNITY): Payer: Self-pay | Admitting: Gastroenterology

## 2023-08-08 ENCOUNTER — Other Ambulatory Visit: Payer: Self-pay

## 2023-08-08 ENCOUNTER — Encounter (HOSPITAL_COMMUNITY)
Admission: RE | Disposition: A | Discharge status: Home / Self Care | Payer: Self-pay | Source: Home / Self Care | Attending: Gastroenterology

## 2023-08-08 DIAGNOSIS — Z87891 Personal history of nicotine dependence: Secondary | ICD-10-CM | POA: Diagnosis not present

## 2023-08-08 DIAGNOSIS — J449 Chronic obstructive pulmonary disease, unspecified: Secondary | ICD-10-CM | POA: Insufficient documentation

## 2023-08-08 DIAGNOSIS — D123 Benign neoplasm of transverse colon: Secondary | ICD-10-CM

## 2023-08-08 DIAGNOSIS — I5032 Chronic diastolic (congestive) heart failure: Secondary | ICD-10-CM | POA: Diagnosis not present

## 2023-08-08 DIAGNOSIS — Z7984 Long term (current) use of oral hypoglycemic drugs: Secondary | ICD-10-CM | POA: Diagnosis not present

## 2023-08-08 DIAGNOSIS — E119 Type 2 diabetes mellitus without complications: Secondary | ICD-10-CM | POA: Insufficient documentation

## 2023-08-08 DIAGNOSIS — K5730 Diverticulosis of large intestine without perforation or abscess without bleeding: Secondary | ICD-10-CM

## 2023-08-08 DIAGNOSIS — M1990 Unspecified osteoarthritis, unspecified site: Secondary | ICD-10-CM | POA: Insufficient documentation

## 2023-08-08 DIAGNOSIS — Z860100 Personal history of colon polyps, unspecified: Secondary | ICD-10-CM | POA: Diagnosis not present

## 2023-08-08 DIAGNOSIS — Z1211 Encounter for screening for malignant neoplasm of colon: Secondary | ICD-10-CM

## 2023-08-08 DIAGNOSIS — K635 Polyp of colon: Secondary | ICD-10-CM | POA: Diagnosis not present

## 2023-08-08 DIAGNOSIS — Z139 Encounter for screening, unspecified: Secondary | ICD-10-CM | POA: Diagnosis not present

## 2023-08-08 DIAGNOSIS — D124 Benign neoplasm of descending colon: Secondary | ICD-10-CM | POA: Insufficient documentation

## 2023-08-08 DIAGNOSIS — F419 Anxiety disorder, unspecified: Secondary | ICD-10-CM | POA: Diagnosis not present

## 2023-08-08 HISTORY — PX: POLYPECTOMY: SHX5525

## 2023-08-08 HISTORY — PX: COLONOSCOPY WITH PROPOFOL: SHX5780

## 2023-08-08 LAB — GLUCOSE, CAPILLARY
Glucose-Capillary: 90 mg/dL (ref 70–99)
Glucose-Capillary: 85 mg/dL (ref 70–99)

## 2023-08-08 SURGERY — COLONOSCOPY WITH PROPOFOL
Anesthesia: Monitor Anesthesia Care

## 2023-08-08 MED ORDER — PROPOFOL 500 MG/50ML IV EMUL
INTRAVENOUS | Status: DC | PRN
  Administered 2023-08-08: 180 ug/kg/min via INTRAVENOUS

## 2023-08-08 MED ORDER — LIDOCAINE 2% (20 MG/ML) 5 ML SYRINGE
INTRAMUSCULAR | Status: DC | PRN
  Administered 2023-08-08: 40 mg via INTRAVENOUS

## 2023-08-08 MED ORDER — SODIUM CHLORIDE 0.9 % IV SOLN
INTRAVENOUS | Status: AC | PRN
  Administered 2023-08-08: 500 mL via INTRAMUSCULAR

## 2023-08-08 MED ORDER — PROPOFOL 10 MG/ML IV BOLUS
INTRAVENOUS | Status: DC | PRN
  Administered 2023-08-08: 20 mg via INTRAVENOUS

## 2023-08-08 SURGICAL SUPPLY — 20 items

## 2023-08-08 NOTE — Discharge Instructions (Signed)

## 2023-08-08 NOTE — Transfer of Care (Signed)
 Immediate Anesthesia Transfer of Care Note  Patient: Lorraine Miller  Procedure(s) Performed: COLONOSCOPY WITH PROPOFOL POLYPECTOMY  Patient Location: Endoscopy Unit  Anesthesia Type:MAC  Level of Consciousness: drowsy and patient cooperative  Airway & Oxygen Therapy: Patient Spontanous Breathing and Patient connected to nasal cannula oxygen  Post-op Assessment: Report given to RN and Post -op Vital signs reviewed and stable  Post vital signs: Reviewed and stable  Last Vitals:  Vitals Value Taken Time  BP    Temp    Pulse    Resp    SpO2      Last Pain:  Vitals:   08/08/23 1003  TempSrc: Tympanic  PainSc: 0-No pain         Complications: No notable events documented.

## 2023-08-08 NOTE — Anesthesia Postprocedure Evaluation (Signed)
 Anesthesia Post Note  Patient: Lorraine Miller  Procedure(s) Performed: COLONOSCOPY WITH PROPOFOL POLYPECTOMY     Patient location during evaluation: PACU Anesthesia Type: MAC Level of consciousness: awake and alert Pain management: pain level controlled Vital Signs Assessment: post-procedure vital signs reviewed and stable Respiratory status: spontaneous breathing, nonlabored ventilation and respiratory function stable Cardiovascular status: stable and blood pressure returned to baseline Anesthetic complications: no   No notable events documented.  Last Vitals:  Vitals:   08/08/23 1150 08/08/23 1157  BP: 126/61 (!) 115/52  Pulse: 64 63  Resp: (!) 24 17  Temp:    SpO2: 98% 98%    Last Pain:  Vitals:   08/08/23 1157  TempSrc:   PainSc: 0-No pain                 Beryle Lathe

## 2023-08-08 NOTE — H&P (Signed)
 [facility] [place] HPI: She had a difficult colon on 06/03/2023.  There was restricted mobility.  She is here to have her colonoscopy with the ultraslim colonoscope.  Past Medical History:  Diagnosis Date   Arthritis    Cancer (HCC)    Breast cancer   Colon polyp    COPD (chronic obstructive pulmonary disease) (HCC)    Depression    Diabetes mellitus without complication (HCC)     Past Surgical History:  Procedure Laterality Date   ABDOMINAL HYSTERECTOMY     ANTERIOR CERVICAL DISCECTOMY     APPENDECTOMY     BREAST LUMPECTOMY     double mastectomy     PARATHYROIDECTOMY     POSTERIOR CERVICAL FUSION/FORAMINOTOMY     surgical breast biopsy     TONSILLECTOMY      Family History  Problem Relation Age of Onset   Multiple myeloma Mother    Asthma Mother    Heart disease Father    Diabetes Father    Healthy Maternal Grandmother    Healthy Maternal Grandfather    Asthma Maternal Grandfather    Stomach cancer Paternal Grandmother    Diabetes Paternal Grandfather    Healthy Paternal Grandfather    Asthma Sister    Asthma Daughter     Social History:  reports that she quit smoking about 22 years ago. Her smoking use included cigarettes. She started smoking about 50 years ago. She has a 7 pack-year smoking history. She has never used smokeless tobacco. She reports that she does not drink alcohol and does not use drugs.  Allergies:  Allergies  Allergen Reactions   Abilify [Aripiprazole]    Ambien [Zolpidem Tartrate]     Had really bad nightmares   Ambien [Zolpidem] Other (See Comments)   Gabapentin     In a fog and could not function   Trazodone Hcl Other (See Comments)    Medications: Scheduled: Continuous:  No results found for this or any previous visit (from the past 24 hours).   No results found.  ROS:  As stated above in the HPI otherwise negative.  There were no vitals taken for this visit.    PE: Gen: NAD, Alert and Oriented HEENT:  Victor/AT, EOMI Neck:  Supple, no LAD Lungs: CTA Bilaterally CV: RRR without M/G/R ABD: Soft, NTND, +BS Ext: No C/C/E  Assessment/Plan: 1) Personal history of polyps - colonoscopy with the ultraslim colonoscope.  Amalio Loe D 08/08/2023, 9:58 AM

## 2023-08-08 NOTE — Op Note (Signed)
 [facility] Patient Name: Lorraine Miller Procedure Date: 08/08/2023 MRN: [id number] Attending MD: Jeani Hawking , MD, [id number] Date of Birth: [birth year] CSN: [id number] Age: 78 Admit Type: Outpatient Procedure:                Colonoscopy Indications:              High risk colon cancer surveillance: Personal                            history of colonic polyps Providers:                Jeani Hawking, MD, Stephens Shire RN, RN, Rhodia Albright,                            Technician Referring MD:              Medicines:                Propofol per Anesthesia Complications:            No immediate complications. Estimated Blood Loss:     Estimated blood loss: none. Procedure:                Pre-Anesthesia Assessment:                           - Prior to the procedure, a History and Physical                            was performed, and patient medications and                            allergies were reviewed. The patient's tolerance of                            previous anesthesia was also reviewed. The risks                            and benefits of the procedure and the sedation                            options and risks were discussed with the patient.                            All questions were answered, and informed consent                            was obtained. Prior Anticoagulants: The patient has                            taken no anticoagulant or antiplatelet agents. ASA                            Grade Assessment: II - A patient with mild systemic  disease. After reviewing the risks and benefits,                            the patient was deemed in satisfactory condition to                            undergo the procedure.                           - Sedation was administered by an anesthesia                            professional. Deep sedation was attained.                           After obtaining informed consent, the colonoscope                             was passed under direct vision. Throughout the                            procedure, the patient's blood pressure, pulse, and                            oxygen saturations were monitored continuously. The                            PCF-H190TL ([id number]) Olympus slim colonoscope was                            introduced through the anus and advanced to the the                            cecum, identified by appendiceal orifice and                            ileocecal valve. The colonoscopy was technically                            difficult and complex. The patient tolerated the                            procedure well. The quality of the bowel                            preparation was evaluated using the BBPS [facility]                            Bowel Preparation Scale) with scores of: Right                            Colon = 3, Transverse Colon = 3 and Left Colon = 3                            (  entire mucosa seen well with no residual staining,                            small fragments of stool or opaque liquid). The                            total BBPS score equals 9. The ileocecal valve,                            appendiceal orifice, and rectum were photographed. Scope In: 11:04:33 AM Scope Out: 11:29:48 AM Scope Withdrawal Time: 0 hours 13 minutes 59 seconds  Total Procedure Duration: 0 hours 25 minutes 15 seconds  Findings:      Five sessile polyps were found in the descending colon and transverse       colon. The polyps were 3 to 7 mm in size. These polyps were removed with       a cold snare. Resection and retrieval were complete.      Scattered medium-mouthed diverticula were found in the sigmoid colon.      The area of stenosis and sharp angulation was traversed using the       ultraslim colonoscope and water infusion. Impression:               - Five 3 to 7 mm polyps in the descending colon and                            in the transverse colon,  removed with a cold snare.                            Resected and retrieved.                           - Diverticulosis in the sigmoid colon. Moderate Sedation:      Not Applicable - Patient had care per Anesthesia. Recommendation:           - Patient has a contact number available for                            emergencies. The signs and symptoms of potential                            delayed complications were discussed with the                            patient. Return to normal activities tomorrow.                            Written discharge instructions were provided to the                            patient.                           - Resume previous diet.                           -  Continue present medications.                           - Await pathology results.                           - Repeat colonoscopy in 3 years for surveillance,                            if clinically appropriate. The ultraslim                            colonoscope will be required with any repeat                            colonoscopies. Procedure Code(s):        --- Professional ---                           [phone], Colonoscopy, flexible; with removal of                            tumor(s), polyp(s), or other lesion(s) by snare                            technique Diagnosis Code(s):        --- Professional ---                           D12.4, Benign neoplasm of descending colon                           Z86.010, Personal history of colonic polyps                           D12.3, Benign neoplasm of transverse colon (hepatic                            flexure or splenic flexure)                           K57.30, Diverticulosis of large intestine without                            perforation or abscess without bleeding CPT copyright 2022 American Medical Association. All rights reserved. The codes documented in this report are preliminary and upon coder review may  be revised to meet current  compliance requirements. Jeani Hawking, MD Jeani Hawking, MD 08/08/2023 11:39:25 AM This report has been signed electronically. Number of Addenda: 0

## 2023-08-08 NOTE — Anesthesia Preprocedure Evaluation (Addendum)
 Anesthesia Evaluation  Patient identified by MRN, date of birth, ID band Patient awake    Reviewed: Allergy & Precautions, NPO status , Patient's Chart, lab work & pertinent test results  History of Anesthesia Complications Negative for: history of anesthetic complications  Airway Mallampati: II  TM Distance: >3 FB Neck ROM: Full    Dental  (+) Dental Advisory Given, Teeth Intact   Pulmonary COPD,  COPD inhaler, former smoker   Pulmonary exam normal        Cardiovascular negative cardio ROS Normal cardiovascular exam   '21 TTE - Trivial MR. Normal EF.    Neuro/Psych  PSYCHIATRIC DISORDERS Anxiety Depression    negative neurological ROS     GI/Hepatic negative GI ROS, Neg liver ROS,,,  Endo/Other  diabetes, Type 2, Oral Hypoglycemic Agents    Renal/GU negative Renal ROS     Musculoskeletal  (+) Arthritis ,    Abdominal   Peds  Hematology negative hematology ROS (+)   Anesthesia Other Findings   Reproductive/Obstetrics  Breast cancer                              Anesthesia Physical Anesthesia Plan  ASA: 3  Anesthesia Plan: MAC   Post-op Pain Management: Minimal or no pain anticipated   Induction:   PONV Risk Score and Plan: 2 and Propofol infusion and Treatment may vary due to age or medical condition  Airway Management Planned: Nasal Cannula and Natural Airway  Additional Equipment: None  Intra-op Plan:   Post-operative Plan:   Informed Consent: I have reviewed the patients History and Physical, chart, labs and discussed the procedure including the risks, benefits and alternatives for the proposed anesthesia with the patient or authorized representative who has indicated his/her understanding and acceptance.       Plan Discussed with: CRNA and Anesthesiologist  Anesthesia Plan Comments:         Anesthesia Quick Evaluation

## 2023-08-10 ENCOUNTER — Encounter (HOSPITAL_COMMUNITY): Payer: Self-pay | Admitting: Gastroenterology

## 2023-08-11 LAB — SURGICAL PATHOLOGY

## 2023-09-22 ENCOUNTER — Ambulatory Visit: Payer: PPO | Attending: Cardiovascular Disease | Admitting: Cardiovascular Disease

## 2023-09-22 ENCOUNTER — Encounter: Payer: Self-pay | Admitting: Cardiovascular Disease

## 2023-09-22 DIAGNOSIS — I10 Essential (primary) hypertension: Secondary | ICD-10-CM | POA: Diagnosis not present

## 2023-09-22 DIAGNOSIS — G479 Sleep disorder, unspecified: Secondary | ICD-10-CM

## 2023-09-22 DIAGNOSIS — F411 Generalized anxiety disorder: Secondary | ICD-10-CM

## 2023-09-22 DIAGNOSIS — E785 Hyperlipidemia, unspecified: Secondary | ICD-10-CM

## 2023-09-22 DIAGNOSIS — J449 Chronic obstructive pulmonary disease, unspecified: Secondary | ICD-10-CM | POA: Diagnosis not present

## 2023-09-22 DIAGNOSIS — I5032 Chronic diastolic (congestive) heart failure: Secondary | ICD-10-CM

## 2023-09-22 NOTE — Progress Notes (Unsigned)
 Cardiology Office Note    Date:  09/22/2023   ID:  Lorraine, Miller [birth year], MRN [id number]  PCP:  [name], MD  Cardiologist:  [name], MD   89-month follow-up evaluation.   History of Present Illness:  Lorraine Miller is a 78 y.o. female who established cardiology care with me in January 2018. I last saw her in April 2024. She presents for a 12 month follow-up cardiology evaluation  Ms. Husband has a history of diabetes mellitus, COPD, hyperlipidemia, as well as depression.  Over10 years ago, she had undergone a stress test by Dr. [name] at the [facility] in [place] which was abnormal and ultimately had a heart catheterization by Dr.[name]..  She was told that her coronaries were normal at that time.  She has a history of breast CA in situ and underwent double mastectomy with subsequent breast reconstructive surgery.  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  Reportedly her husband took her blood pressure which was very low.  By the time EMS arrived, the patient states EMS blood pressure recording was 90/55.  She presented to [facility] hospital and on presentation her blood pressure had risen to 115/52.  CT scan did not reveal any acute abnormality.  She had mild left frontal sinus disease.  A chest x-ray showed COPD with mild bibasal atelectasis glass scarring.  Her ECG was unremarkable.  Troponins were negative.  She again had another short-lived episode of presyncope on New Year's Day, which ultimately resolved.  She is uncertain if she was dehydrated on both of these occasions.  She denied associated chest tightness.  She saw me initially in January 2018 after she had seen on the [facility] record that she may have mild renal failure and heart failure.  The patient states she was never told this and presented for cardiologic evaluation.  I reassured her after reviewing the [facility] medical record  laboratory that she did not have renal failure, but actually had stage II renal staging.  I scheduled her for 2-D echo Doppler study which was done 03/15/2017 and showed an EF of 55-60%.  There was mild grade 1 diastolic dysfunction with aortic valve sclerosis.  She underwent follow-up laboratory on February 1 18, which showed a creatinine of 0.83.  Lipid studies were notable for total cholesterol 161, HDL 47, LDL 85, and triglycerides 409.    I recommended that she discontinue lovastatin  and changed rosuvastatin  10 mg for more aggressive lipid-lowering.  Subsequent laboratory in October 2018 showed improvement in LDL cholesterol down to 54.  When I last saw her in March 2019 she was continued to do well from a cardiac standpoint.  She has continued to feel well from a heart standpoint.  Her blood pressure has always been normal.  She has had issues with left leg discomfort.  S  She has had issues remotely of cervical neck disease but does not recall being evaluated for lumbar sacral disease.  She also at times notes that her fingers get cold.  There is remote tobacco history but she quit smoking in 2003.  He does not require being cold temperatures for this sensation.    She was evaluated in a telemedicine visit by Marcie Sever in June 2020 and at that time remained cardiac stable.  I saw her in August 2021 at which time she continued to do well.  She specifically denied chest pain or shortness of breath.  She does experience a rare palpitation which typically are stress mediated.  At times she does have considerable stress with her husband who has some heart failure issues.  She is followed by Dr. Shlomo Downer and he recently increased her Celexa  dose.  She also was having some sleep issues and she is now sleeping well with the addition of Lunesta 2 mg.  She underwent repeat laboratory in April 2021 by Dr. Shlomo Downer.  Renal function was excellent with a creatinine of 0.85.  LFTs were normal.  Hemoglobin A1c was 6.2.   LDL cholesterol was 49.    Since I saw her she has been evaluated by [name] with her last evaluation in November 2021.  At that time she presented for evaluation of a syncopal spell.  Her blood pressure was low.  That time losartan  was discontinued. She underwent an echo Doppler study in December 2021 which showed normal systolic function with EF at 60 to 65%.  There was no significant valvular abnormality.  I saw her on November 15, 2020 at which time she felt well.  She was taking metoprolol  succinate 12.5 mg daily, rosuvastatin  10 mg, and was taking furosemide  40 mg as needed depending upon edema.  She is followed by Dr. Schuyler Custard who checks laboratory.  Her blood pressure has been stable.  She denied any further episodes of presyncope or syncope and denied any chest pain.    I saw her on June 25, 2021 at which time she felt well and denied any chest pain, shortness of breath, or palpitations.  She was not having any orthostatic symptomatology.   There has not been any edema and she has rarely taking furosemide .  She continues to be on losartan  12.5 mg daily in addition to metoprolol  succinate 12.5 mg.  She continues to be on rosuvastatin  10 mg for hyperlipidemia and LDL cholesterol had been in the 50s.  She takes Symbicort  and as needed albuterol  for mild COPD.  She continues to take citalopram  for depression.    I last saw her on September 10, 2022. She states her husband is starting to develop some dementia symptoms.  She denies chest pain shortness of breath or palpitations.  She has allergies particularly with pollen.  She is followed by Dr. [name] who checks laboratory.  She presents for evaluation.  Past Medical History:  Diagnosis Date   Arthritis    Cancer [facility])    Breast cancer   Colon polyp    COPD (chronic obstructive pulmonary disease) (HCC)    Depression    Diabetes mellitus without complication (HCC)     Past Surgical History:  Procedure Laterality Date   ABDOMINAL  HYSTERECTOMY     ANTERIOR CERVICAL DISCECTOMY     APPENDECTOMY     BREAST LUMPECTOMY     COLONOSCOPY WITH PROPOFOL  N/A 08/08/2023   Procedure: COLONOSCOPY WITH PROPOFOL ;  Surgeon: Alvis Jourdain, MD;  Location: [facility] ENDOSCOPY;  Service: Gastroenterology;  Laterality: N/A;   double mastectomy     PARATHYROIDECTOMY     POLYPECTOMY  08/08/2023   Procedure: POLYPECTOMY;  Surgeon: Alvis Jourdain, MD;  Location: [facility] ENDOSCOPY;  Service: Gastroenterology;;   POSTERIOR CERVICAL FUSION/FORAMINOTOMY     surgical breast biopsy     TONSILLECTOMY      Current Medications: Outpatient Medications Prior to Visit  Medication Sig Dispense Refill   albuterol  (PROVENTIL  HFA) 108 (90 Base) MCG/ACT inhaler Inhale 2 puffs every 6 (six) hours as needed into the lungs for wheezing or shortness of  breath. 1 Inhaler 11   budesonide -formoterol  (SYMBICORT ) 80-4.5 MCG/ACT inhaler Inhale 2 puffs as directed into the lungs. 1 Inhaler 11   Cholecalciferol (VITAMIN D) 2000 UNITS tablet Take 2,000 Units by mouth daily.     citalopram  (CELEXA ) 20 MG tablet Take 30 mg by mouth daily. 1 1/2 Tablet Daily     FLUZONE HIGH-DOSE QUADRIVALENT 0.7 ML SUSY      furosemide  (LASIX ) 40 MG tablet Take 1 tablet (40 mg total) by mouth daily as needed for edema. 30 tablet 2   losartan  (COZAAR ) 25 MG tablet Take 12.5 mg by mouth daily. Take 1/2 (12.5mg ) tablet by mouth daily     Magnesium 400 MG CAPS Take 400 mg by mouth.     metFORMIN  (GLUCOPHAGE ) 500 MG tablet Take by mouth. Take 1000 mg ( 2 tablets) in the morning meal  and  500 mg ( 1 tablet) in the evening with meals     metoprolol  succinate (TOPROL -XL) 25 MG 24 hr tablet TAKE 1/2 TABLET BY MOUTH DAILY 45 tablet 1   mirtazapine (REMERON) 15 MG tablet Take 7.5 mg by mouth daily.     PFIZER COVID-19 VAC BIVALENT injection      rosuvastatin  (CRESTOR ) 10 MG tablet TAKE ONE TABLET BY MOUTH DAILY 90 tablet 2   metFORMIN  (GLUCOPHAGE ) 500 MG tablet Take 1 tablet (500 mg total) by mouth 2 (two) times  daily with a meal. 60 tablet 2   No facility-administered medications prior to visit.     Allergies:   Abilify [aripiprazole], Ambien [zolpidem tartrate], Ambien [zolpidem], Gabapentin, and Trazodone hcl   Social History   Socioeconomic History   Marital status: Married    Spouse name: Not on file   Number of children: 2   Years of education: 14   Highest education level: Not on file  Occupational History   Not on file  Tobacco Use   Smoking status: Former    Current packs/day: 0.00    Average packs/day: 0.3 packs/day for 28.0 years (7.0 ttl pk-yrs)    Types: Cigarettes    Start date: 06/03/1973    Quit date: 06/03/2001    Years since quitting: 22.3   Smokeless tobacco: Never  Substance and Sexual Activity   Alcohol use: No    Alcohol/week: 0.0 standard drinks of alcohol   Drug use: No   Sexual activity: Not on file  Other Topics Concern   Not on file  Social History Narrative   Fun: Paint and crafts   Denies religious beliefs effecting health care.   Denies abuse and feels safe at home.    Social Drivers of Corporate investment banker Strain: Not on file  Food Insecurity: Not on file  Transportation Needs: Not on file  Physical Activity: Not on file  Stress: Not on file  Social Connections: Not on file     Family History:  The patient's family history includes Asthma in her daughter, maternal grandfather, mother, and sister; Diabetes in her father and paternal grandfather; Healthy in her maternal grandfather, maternal grandmother, and paternal grandfather; Heart disease in her father; Multiple myeloma in her mother; Stomach cancer in her paternal grandmother.   ROS General: Negative; No fevers, chills, or night sweats;  HEENT: Negative; No changes in vision or hearing, sinus congestion, difficulty swallowing Pulmonary: No recent cough, wheezing, shortness of breath, hemoptysis Cardiovascular: See history of present illness GI: Negative; No nausea, vomiting,  diarrhea, or abdominal pain GU: Negative; No dysuria, hematuria, or difficulty voiding  Musculoskeletal: Negative; no myalgias, joint pain, or weakness Hematologic/Oncology: Negative; no easy bruising, bleeding Endocrine: Positive for diabetes mellitus Neuro: Negative; no changes in balance, headaches Skin: Negative; No rashes or skin lesions Psychiatric: Positive for increased stress, improved, remote history of depression Sleep: Negative; No snoring, daytime sleepiness, hypersomnolence, bruxism, restless legs, hypnogognic hallucinations, no cataplexy Other comprehensive 14 point system review is negative.   PHYSICAL EXAM:   VS:  There were no vitals taken for this visit.    Repeat blood pressure by me was 118/68 supine and 102/66 standing  Wt Readings from Last 3 Encounters:  08/08/23 143 lb (64.9 kg)  09/10/22 153 lb 12.8 oz (69.8 kg)  06/25/21 148 lb 3.2 oz (67.2 kg)    General: Alert, oriented, no distress.  Skin: normal turgor, no rashes, warm and dry HEENT: Normocephalic, atraumatic. Pupils equal round and reactive to light; sclera anicteric; extraocular muscles intact;  Nose without nasal septal hypertrophy Mouth/Parynx benign; Mallinpatti scale 3 Neck: No JVD, no carotid bruits; normal carotid upstroke Lungs: clear to ausculatation and percussion; no wheezing or rales Chest wall: without tenderness to palpitation Heart: PMI not displaced, RRR, s1 s2 normal, 1/6 systolic murmur, no diastolic murmur, no rubs, gallops, thrills, or heaves Abdomen: soft, nontender; no hepatosplenomehaly, BS+; abdominal aorta nontender and not dilated by palpation. Back: no CVA tenderness Pulses 2+ Musculoskeletal: full range of motion, normal strength, no joint deformities Extremities: no clubbing cyanosis or edema, Homan's sign negative  Neurologic: grossly nonfocal; Cranial nerves grossly wnl Psychologic: Normal mood and affect   Studies/Labs Reviewed:   September 10, 2022 ECG (independently  read by me): NSR at 67, no ST changes  June 25, 2021   ECG (independently read by me): NSR at 61; no ectopy  November 15, 2020 ECG (independently read by me): Normal sinus rhythm at 64 bpm, normal intervals, no ST segment changes.  No ectopy.  August 2021 ECG (independently read by me): Normal sinus rhythm at 72 bpm; no ST changes, normal intervals  March 2019 ECG (independently read by me): sinus rhythm at 66 bpm.  No ectopy.  Normal intervals.  No ST segment changes.  October 2018 ECG (independently read by me): normal sinus rhythm at 80 bpm.  Normal intervals.  No ectopy.  March 2018 ECG (independently read by me): Normal sinus rhythm at 81 bpm.  Normal intervals.  No ectopy.  No ST segment changes.  January 2018 EKG:  EKG is ordered today.  ECG (independently read by me): Normal sinus rhythm at 77 bpm.  No significant ST-T changes.  PR interval 114 ms; QRS duration 84 ms; QTc interval normal at 411 milliseconds.  Recent Labs:    Latest Ref Rng & Units 11/14/2016    8:25 AM 07/04/2016    9:01 AM 05/01/2016    4:00 PM  BMP  Glucose 65 - 99 mg/dL 161  096  045   BUN 7 - 25 mg/dL 18  19  10    Creatinine 0.60 - 0.93 mg/dL 4.09  8.11  9.14   Sodium 135 - 146 mmol/L 139  140  141   Potassium 3.5 - 5.3 mmol/L 4.6  4.3  3.8   Chloride 98 - 110 mmol/L 103  104  105   CO2 20 - 31 mmol/L 27  28  27    Calcium  8.6 - 10.4 mg/dL 9.3  8.9  9.4         Latest Ref Rng & Units 12/16/2016    8:35 AM 11/14/2016  8:25 AM 07/04/2016    9:01 AM  Hepatic Function  Total Protein 6.0 - 8.5 g/dL 6.2  6.2  6.3   Albumin 3.5 - 4.8 g/dL 4.7  4.3  4.1   AST 0 - 40 IU/L 25  28  18    ALT 0 - 32 IU/L 30  42  24   Alk Phosphatase 39 - 117 IU/L 61  60  55   Total Bilirubin 0.0 - 1.2 mg/dL 0.9  1.2  1.6   Bilirubin, Direct 0.00 - 0.40 mg/dL 7.84          Latest Ref Rng & Units 07/04/2016    9:01 AM 05/01/2016    4:00 PM 12/20/2013    2:25 PM  CBC  WBC 3.8 - 10.8 K/uL 6.1  10.5  8.7   Hemoglobin 11.7 -  15.5 g/dL 69.6  29.5  28.4   Hematocrit 35.0 - 45.0 % 40.7  41.5  42.3   Platelets 140 - 400 K/uL 343  330  320    Lab Results  Component Value Date   MCV 87.5 07/04/2016   MCV 87.7 05/01/2016   MCV 91 12/20/2013   Lab Results  Component Value Date   TSH 3.35 07/04/2016   Lab Results  Component Value Date   HGBA1C 6.0 (H) 07/04/2016     BNP No results found for: "BNP"  ProBNP No results found for: "PROBNP"   Lipid Panel     Component Value Date/Time   CHOL 129 11/14/2016 0825   TRIG 77 11/14/2016 0825   HDL 58 11/14/2016 0825   CHOLHDL 2.2 11/14/2016 0825   VLDL 15 11/14/2016 0825   LDLCALC 56 11/14/2016 0825     RADIOLOGY: No results found.   Additional studies/ records that were reviewed today include:  I reviewed the emergency room records as well as records from [facility], recent labs and echo.   ECHO: 05/22/2020 IMPRESSIONS   1. Left ventricular ejection fraction, by estimation, is 60 to 65%. The  left ventricle has normal function. The left ventricle has no regional  wall motion abnormalities. Left ventricular diastolic parameters are  indeterminate.   2. Right ventricular systolic function is normal. The right ventricular  size is normal. There is normal pulmonary artery systolic pressure.   3. The mitral valve is normal in structure. Trivial mitral valve  regurgitation. No evidence of mitral stenosis.   4. The aortic valve was not well visualized. Aortic valve regurgitation  is not visualized. No aortic stenosis is present.   5. The inferior vena cava is normal in size with greater than 50%  respiratory variability, suggesting right atrial pressure of 3 mmHg.   ASSESSMENT:    1. Chronic diastolic heart failure (HCC)   2. Sleep disturbance    PLAN:  Ms. Sanika Brosious is a 78 year old female who has a history of type 2 diabetes mellitus, hypertension, hyperlipidemia, and COPD.   A nuclear stress test done over 15 years ago  raised concern for ischemia and definitive cardiac catheterization done in [place] revealed normal coronary arteries.  She had experienced 2 episodes of presyncope while standing.  When I initially saw her, she was not orthostatic.  I reduced her diuretic regimen.  She did not have renal failure or CHF.  Her echo Doppler study of February 2018 revealed normal systolic function although there was evidence for mild grade 1 diastolic dysfunction. Carotid studies revealed mild plaque bilaterally, less than 39%.  When seen in August  2021 she was on losartan  25 mg in addition to metoprolol  succinate 12.5 mg daily.  She had a near syncopal spell associated with hypotension at which time she was seen in November 2021 by [name], PA and with low blood pressure it was recommended to discontinue losartan .  Presently, her blood pressure is stable but on the low side and she does have mild drop in blood pressure going supine to standing.  She has been taking losartan  12.5 mg.  She she rarely takes furosemide  although has a prescription to take as needed for dyspnea.  She is on rosuvastatin  10 mg for hyperlipidemia.  She takes citalopram  30 mg for depression.  She continues to take Symbicort  and as needed albuterol .  She has lower extremity varicosities without significant edema.  Hemoglobin A1c recently was elevated at 6.7.  She is now on metformin  1000 mg in the morning and 500 mg in the evening followed by Dr. [name].  She is not having any anginal symptoms or palpitations.  As long as she is stable I will see her in 1 year for follow-up evaluation.   Medication Adjustments/Labs and Tests Ordered: Current medicines are reviewed at length with the patient today.  Concerns regarding medicines are outlined above.  Medication changes, Labs and Tests ordered today are listed in the Patient Instructions below. There are no Patient Instructions on file for this visit.   Signed, [name], MD ,  [facility] 09/22/2023 8:19 AM    [facility] [address], [place], [place]  [address] Phone: [phone]

## 2023-09-22 NOTE — Patient Instructions (Signed)
 Medication Instructions:  NO CHANGES *If you need a refill on your cardiac medications before your next appointment, please call your pharmacy*  Lab Work: NO LABS If you have labs (blood work) drawn today and your tests are completely normal, you will receive your results only by: MyChart Message (if you have MyChart) OR A paper copy in the mail If you have any lab test that is abnormal or we need to change your treatment, we will call you to review the results.  Testing/Procedures: NO TESTING  Follow-Up: At [facility], you and your health needs are our priority.  As part of our continuing mission to provide you with exceptional heart care, our providers are all part of one team.  This team includes your primary Cardiologist (physician) and Advanced Practice Providers or APPs (Physician Assistants and Nurse Practitioners) who all work together to provide you with the care you need, when you need it.  Your next appointment:   6 month(s)  Provider:   Luana Rumple, MD    Other Instructions   1st Floor: - Lobby - Registration  - Pharmacy  - Lab - Cafe  2nd Floor: - PV Lab - Diagnostic Testing (echo, CT, nuclear med)  3rd Floor: - Vacant  4th Floor: - TCTS (cardiothoracic surgery) - AFib Clinic - Structural Heart Clinic - Vascular Surgery  - Vascular Ultrasound  5th Floor: - HeartCare Cardiology (general and EP) - Clinical Pharmacy for coumadin, hypertension, lipid, weight-loss medications, and med management appointments    Valet parking services will be available as well.

## 2023-09-24 ENCOUNTER — Encounter: Payer: Self-pay | Admitting: Cardiovascular Disease

## 2023-10-09 ENCOUNTER — Other Ambulatory Visit: Payer: Self-pay

## 2023-10-09 MED ORDER — METOPROLOL SUCCINATE ER 25 MG PO TB24: 12.5000 mg | ORAL_TABLET | Freq: Every day | ORAL | 3 refills | Status: DC

## 2024-02-25 DIAGNOSIS — Z961 Presence of intraocular lens: Secondary | ICD-10-CM | POA: Diagnosis not present

## 2024-02-25 DIAGNOSIS — H2512 Age-related nuclear cataract, left eye: Secondary | ICD-10-CM | POA: Diagnosis not present

## 2024-02-25 DIAGNOSIS — E119 Type 2 diabetes mellitus without complications: Secondary | ICD-10-CM | POA: Diagnosis not present

## 2024-04-06 DIAGNOSIS — E785 Hyperlipidemia, unspecified: Secondary | ICD-10-CM | POA: Diagnosis not present

## 2024-04-06 DIAGNOSIS — I129 Hypertensive chronic kidney disease with stage 1 through stage 4 chronic kidney disease, or unspecified chronic kidney disease: Secondary | ICD-10-CM | POA: Diagnosis not present

## 2024-04-06 DIAGNOSIS — E1122 Type 2 diabetes mellitus with diabetic chronic kidney disease: Secondary | ICD-10-CM | POA: Diagnosis not present

## 2024-04-14 DIAGNOSIS — M8580 Other specified disorders of bone density and structure, unspecified site: Secondary | ICD-10-CM | POA: Diagnosis not present

## 2024-04-14 DIAGNOSIS — Z860101 Personal history of adenomatous and serrated colon polyps: Secondary | ICD-10-CM | POA: Diagnosis not present

## 2024-04-14 DIAGNOSIS — I6523 Occlusion and stenosis of bilateral carotid arteries: Secondary | ICD-10-CM | POA: Diagnosis not present

## 2024-04-14 DIAGNOSIS — I129 Hypertensive chronic kidney disease with stage 1 through stage 4 chronic kidney disease, or unspecified chronic kidney disease: Secondary | ICD-10-CM | POA: Diagnosis not present

## 2024-04-14 DIAGNOSIS — F411 Generalized anxiety disorder: Secondary | ICD-10-CM | POA: Diagnosis not present

## 2024-04-14 DIAGNOSIS — Z0000 Encounter for general adult medical examination without abnormal findings: Secondary | ICD-10-CM | POA: Diagnosis not present

## 2024-04-14 DIAGNOSIS — E785 Hyperlipidemia, unspecified: Secondary | ICD-10-CM | POA: Diagnosis not present

## 2024-04-14 DIAGNOSIS — F339 Major depressive disorder, recurrent, unspecified: Secondary | ICD-10-CM | POA: Diagnosis not present

## 2024-04-14 DIAGNOSIS — J449 Chronic obstructive pulmonary disease, unspecified: Secondary | ICD-10-CM | POA: Diagnosis not present

## 2024-04-14 DIAGNOSIS — E1122 Type 2 diabetes mellitus with diabetic chronic kidney disease: Secondary | ICD-10-CM | POA: Diagnosis not present

## 2024-04-14 DIAGNOSIS — I10 Essential (primary) hypertension: Secondary | ICD-10-CM | POA: Diagnosis not present

## 2024-04-20 DIAGNOSIS — I10 Essential (primary) hypertension: Secondary | ICD-10-CM | POA: Diagnosis not present

## 2024-04-20 DIAGNOSIS — E785 Hyperlipidemia, unspecified: Secondary | ICD-10-CM | POA: Diagnosis not present

## 2024-04-20 DIAGNOSIS — I6529 Occlusion and stenosis of unspecified carotid artery: Secondary | ICD-10-CM | POA: Diagnosis not present

## 2024-04-20 DIAGNOSIS — N1831 Chronic kidney disease, stage 3a: Secondary | ICD-10-CM | POA: Diagnosis not present

## 2024-04-20 DIAGNOSIS — E1122 Type 2 diabetes mellitus with diabetic chronic kidney disease: Secondary | ICD-10-CM | POA: Diagnosis not present

## 2024-06-11 ENCOUNTER — Encounter: Payer: Self-pay | Admitting: Cardiovascular Disease

## 2024-06-11 ENCOUNTER — Ambulatory Visit: Attending: Cardiovascular Disease | Admitting: Cardiovascular Disease

## 2024-06-11 VITALS — BP 112/52 | HR 70 | Ht 65.5 in | Wt 143.4 lb

## 2024-06-11 DIAGNOSIS — E119 Type 2 diabetes mellitus without complications: Secondary | ICD-10-CM

## 2024-06-11 DIAGNOSIS — J449 Chronic obstructive pulmonary disease, unspecified: Secondary | ICD-10-CM

## 2024-06-11 DIAGNOSIS — I952 Hypotension due to drugs: Secondary | ICD-10-CM | POA: Diagnosis not present

## 2024-06-11 DIAGNOSIS — E785 Hyperlipidemia, unspecified: Secondary | ICD-10-CM | POA: Diagnosis not present

## 2024-06-11 NOTE — Patient Instructions (Signed)
 Medication Instructions:   Stop taking metoprolol   ( toprol ) *If you need a refill on your cardiac medications before your next appointment, please call your pharmacy*   Lab Work: Not needed    Testing/Procedures:  Not needed  Follow-Up: At [facility], you and your health needs are our priority.  As part of our continuing mission to provide you with exceptional heart care, we have created designated Provider Care Teams.  These Care Teams include your primary Cardiologist (physician) and Advanced Practice Providers (APPs -  Physician Assistants and Nurse Practitioners) who all work together to provide you with the care you need, when you need it.     Your next appointment:   12 month(s)  The format for your next appointment:   In Person  Provider:   Jerel Balding, MD

## 2024-06-12 NOTE — Progress Notes (Signed)
" °  Cardiology Office Note   Date:  06/12/2024  ID:  Lorraine Miller, Lorraine Miller 1946/05/09, MRN [id number] PCP: Clarice Nottingham, MD  [facility] Providers Cardiologist:  Jerel Balding, MD     History of Present Illness Lorraine Miller is a 79 y.o. female with a history of type 2 diabetes mellitus, hypercholesterolemia, COPD remote presyncope history of breast cancer status post bilateral mastectomy arteries on remote coronary angiography, here to establish urology follow-up after Dr. Joesphine retirement.  Her husband Lorraine Miller is with her for the same reason.  The patient specifically denies any chest pain at rest or with exertion, dyspnea at rest or with exertion, orthopnea, paroxysmal nocturnal dyspnea, syncope, palpitations, focal neurological deficits, intermittent claudication, lower extremity edema, unexplained weight gain, cough, hemoptysis or wheezing.  Her most recent evaluation was an echocardiogram in 2021 that showed normal left ventricular systolic function with an ejection fraction of 55 to 60%.  Diastolic function was described as indeterminate but the mitral annulus e' velocities are excellent suggesting normal device function.  That same year she had an arrhythmia monitor that was an essentially normal study with exception of a single 6 beat run of nonsustained atrial tachycardia.  [place] and she was told that her coronaries were normal, roughly 20 years ago.  Metabolic control is excellent with a hemoglobin A1c of 6.4% (on metformin  monotherapy), HDL 61, LDL 49, normal triglycerides on rosuvastatin  10 mg daily.  Her blood pressure t today is quite low at 112/52 and she is often seeing diastolics in the 50s at home.  She takes a very low-dose of losartan  for protection from nephropathy and is also on a tiny dose of metoprolol  succinate.   Studies Reviewed     Arrhythmia monitor 2021 Echocardiogram 2021 Risk Assessment/Calculations            Physical Exam VS:  BP (!) 112/52 (BP Location: Left Arm, Patient Position: Sitting, Cuff Size: Normal)   Pulse 70   Ht 5' 5.5 (1.664 m)   Wt 143 lb 6.4 oz (65 kg)   SpO2 96%   BMI 23.50 kg/m        Wt Readings from Last 3 Encounters:  06/11/24 143 lb 6.4 oz (65 kg)  09/22/23 145 lb 3.2 oz (65.9 kg)  08/08/23 143 lb (64.9 kg)    GEN: Well nourished, well developed in no acute distress NECK: No JVD; No carotid bruits CARDIAC: RRR, no murmurs, rubs, gallops RESPIRATORY:  Clear to auscultation without rales, wheezing or rhonchi  ABDOMEN: Soft, non-tender, non-distended EXTREMITIES:  No edema; No deformity   ASSESSMENT AND PLAN Diastolic hypotension: Increases the risk of presyncope, falls and injuries.  Stop metoprolol  succinate.  If diastolic blood pressure remains very low, would also recommend discontinuing the losartan . HLP: Excellent lipid profile on the current medications.  Continue rosuvastatin . DM: She pays very careful attention to diet and has well-controlled blood glucose levels. COPD: Quit smoking many years ago.  Symptoms are well-controlled on Symbicort  and occasional albuterol  inhaler. Reported history of diastolic heart failure is probably not an accurate assessment.  She       Dispo: No changes to medications.  Follow-up in 1 year.  Signed, Jerel Balding, MD   "
